# Patient Record
Sex: Male | Born: 1935 | Race: White | Hispanic: No | Marital: Married | Smoking: Former smoker
Health system: Southern US, Community
[De-identification: ages and names within clinical notes are randomized; demographics above are authoritative.]

## PROBLEM LIST (undated history)

## (undated) DIAGNOSIS — M1990 Unspecified osteoarthritis, unspecified site: Secondary | ICD-10-CM

## (undated) DIAGNOSIS — I10 Essential (primary) hypertension: Secondary | ICD-10-CM

## (undated) DIAGNOSIS — I071 Rheumatic tricuspid insufficiency: Secondary | ICD-10-CM

## (undated) DIAGNOSIS — G4733 Obstructive sleep apnea (adult) (pediatric): Secondary | ICD-10-CM

## (undated) DIAGNOSIS — D649 Anemia, unspecified: Secondary | ICD-10-CM

## (undated) DIAGNOSIS — I5022 Chronic systolic (congestive) heart failure: Secondary | ICD-10-CM

## (undated) DIAGNOSIS — I2510 Atherosclerotic heart disease of native coronary artery without angina pectoris: Secondary | ICD-10-CM

## (undated) DIAGNOSIS — S42309A Unspecified fracture of shaft of humerus, unspecified arm, initial encounter for closed fracture: Secondary | ICD-10-CM

## (undated) DIAGNOSIS — N259 Disorder resulting from impaired renal tubular function, unspecified: Secondary | ICD-10-CM

## (undated) DIAGNOSIS — Z95810 Presence of automatic (implantable) cardiac defibrillator: Secondary | ICD-10-CM

## (undated) DIAGNOSIS — E079 Disorder of thyroid, unspecified: Secondary | ICD-10-CM

## (undated) DIAGNOSIS — K219 Gastro-esophageal reflux disease without esophagitis: Secondary | ICD-10-CM

## (undated) DIAGNOSIS — I255 Ischemic cardiomyopathy: Secondary | ICD-10-CM

## (undated) DIAGNOSIS — E785 Hyperlipidemia, unspecified: Secondary | ICD-10-CM

## (undated) HISTORY — DX: Obstructive sleep apnea (adult) (pediatric): G47.33

## (undated) HISTORY — DX: Presence of automatic (implantable) cardiac defibrillator: Z95.810

## (undated) HISTORY — DX: Atherosclerotic heart disease of native coronary artery without angina pectoris: I25.10

## (undated) HISTORY — DX: Essential (primary) hypertension: I10

## (undated) HISTORY — DX: Anemia, unspecified: D64.9

## (undated) HISTORY — DX: Gastro-esophageal reflux disease without esophagitis: K21.9

## (undated) HISTORY — DX: Disorder of thyroid, unspecified: E07.9

## (undated) HISTORY — DX: Rheumatic tricuspid insufficiency: I07.1

## (undated) HISTORY — PX: CORONARY ARTERY BYPASS GRAFT: SHX141

## (undated) HISTORY — DX: Hyperlipidemia, unspecified: E78.5

## (undated) HISTORY — DX: Unspecified osteoarthritis, unspecified site: M19.90

## (undated) HISTORY — DX: Chronic systolic (congestive) heart failure: I50.22

## (undated) HISTORY — PX: TOTAL HIP ARTHROPLASTY: SHX124

## (undated) HISTORY — PX: INSERT / REPLACE / REMOVE PACEMAKER: SUR710

## (undated) HISTORY — DX: Unspecified fracture of shaft of humerus, unspecified arm, initial encounter for closed fracture: S42.309A

## (undated) HISTORY — DX: Ischemic cardiomyopathy: I25.5

## (undated) HISTORY — DX: Disorder resulting from impaired renal tubular function, unspecified: N25.9

---

## 2003-12-05 ENCOUNTER — Ambulatory Visit: Payer: Self-pay | Admitting: Anesthesiology

## 2004-01-16 ENCOUNTER — Ambulatory Visit: Payer: Self-pay | Admitting: Anesthesiology

## 2004-02-08 ENCOUNTER — Ambulatory Visit: Payer: Self-pay | Admitting: Otolaryngology

## 2004-02-12 ENCOUNTER — Ambulatory Visit: Payer: Self-pay | Admitting: Anesthesiology

## 2004-03-10 ENCOUNTER — Ambulatory Visit: Payer: Self-pay | Admitting: Anesthesiology

## 2004-04-08 ENCOUNTER — Ambulatory Visit: Payer: Self-pay | Admitting: Anesthesiology

## 2004-05-13 ENCOUNTER — Ambulatory Visit: Payer: Self-pay | Admitting: Anesthesiology

## 2004-10-10 ENCOUNTER — Ambulatory Visit: Payer: Self-pay | Admitting: Gastroenterology

## 2004-10-15 ENCOUNTER — Ambulatory Visit: Payer: Self-pay | Admitting: Family Medicine

## 2004-11-06 ENCOUNTER — Ambulatory Visit: Payer: Self-pay | Admitting: Gastroenterology

## 2004-12-03 ENCOUNTER — Ambulatory Visit: Payer: Self-pay | Admitting: Physician Assistant

## 2004-12-09 ENCOUNTER — Ambulatory Visit: Payer: Self-pay | Admitting: Gastroenterology

## 2005-03-13 ENCOUNTER — Ambulatory Visit: Payer: Self-pay | Admitting: Gastroenterology

## 2005-04-07 ENCOUNTER — Ambulatory Visit: Payer: Self-pay | Admitting: Gastroenterology

## 2005-07-30 ENCOUNTER — Ambulatory Visit: Payer: Self-pay | Admitting: Anesthesiology

## 2005-07-31 ENCOUNTER — Ambulatory Visit: Payer: Self-pay | Admitting: Anesthesiology

## 2005-07-31 ENCOUNTER — Other Ambulatory Visit: Payer: Self-pay

## 2005-08-17 ENCOUNTER — Ambulatory Visit: Payer: Self-pay | Admitting: Gastroenterology

## 2005-08-26 ENCOUNTER — Ambulatory Visit: Payer: Self-pay | Admitting: Anesthesiology

## 2005-09-24 ENCOUNTER — Ambulatory Visit: Payer: Self-pay | Admitting: Anesthesiology

## 2005-10-08 ENCOUNTER — Ambulatory Visit: Payer: Self-pay | Admitting: Gastroenterology

## 2005-10-19 ENCOUNTER — Ambulatory Visit: Payer: Self-pay | Admitting: Anesthesiology

## 2005-10-28 ENCOUNTER — Ambulatory Visit: Payer: Self-pay | Admitting: Internal Medicine

## 2006-01-07 ENCOUNTER — Ambulatory Visit: Payer: Self-pay | Admitting: Anesthesiology

## 2006-01-13 ENCOUNTER — Encounter: Payer: Self-pay | Admitting: Internal Medicine

## 2006-02-01 ENCOUNTER — Ambulatory Visit: Payer: Self-pay | Admitting: Anesthesiology

## 2006-03-04 ENCOUNTER — Ambulatory Visit: Payer: Self-pay | Admitting: Anesthesiology

## 2006-04-06 ENCOUNTER — Ambulatory Visit: Payer: Self-pay | Admitting: Anesthesiology

## 2006-04-13 ENCOUNTER — Ambulatory Visit: Payer: Self-pay | Admitting: Gastroenterology

## 2006-05-01 ENCOUNTER — Other Ambulatory Visit: Payer: Self-pay

## 2006-05-01 ENCOUNTER — Inpatient Hospital Stay: Payer: Self-pay | Admitting: Internal Medicine

## 2006-05-02 ENCOUNTER — Other Ambulatory Visit: Payer: Self-pay

## 2006-05-03 ENCOUNTER — Other Ambulatory Visit: Payer: Self-pay

## 2006-05-05 ENCOUNTER — Other Ambulatory Visit: Payer: Self-pay

## 2006-05-10 ENCOUNTER — Ambulatory Visit: Payer: Self-pay | Admitting: Anesthesiology

## 2006-05-14 ENCOUNTER — Ambulatory Visit: Payer: Self-pay | Admitting: Vascular Surgery

## 2006-06-02 ENCOUNTER — Ambulatory Visit: Payer: Self-pay | Admitting: Anesthesiology

## 2006-06-23 ENCOUNTER — Ambulatory Visit: Payer: Self-pay | Admitting: Anesthesiology

## 2006-08-02 ENCOUNTER — Ambulatory Visit: Payer: Self-pay | Admitting: Anesthesiology

## 2006-08-23 ENCOUNTER — Ambulatory Visit: Payer: Self-pay | Admitting: Anesthesiology

## 2006-08-28 ENCOUNTER — Ambulatory Visit: Payer: Self-pay | Admitting: Anesthesiology

## 2006-09-01 ENCOUNTER — Ambulatory Visit: Payer: Self-pay | Admitting: Pain Medicine

## 2006-09-14 ENCOUNTER — Ambulatory Visit: Payer: Self-pay | Admitting: Pain Medicine

## 2006-09-20 ENCOUNTER — Other Ambulatory Visit: Payer: Self-pay

## 2006-09-20 ENCOUNTER — Inpatient Hospital Stay: Payer: Self-pay | Admitting: Internal Medicine

## 2006-09-21 ENCOUNTER — Other Ambulatory Visit: Payer: Self-pay

## 2006-09-30 ENCOUNTER — Ambulatory Visit: Payer: Self-pay | Admitting: Pain Medicine

## 2006-10-13 ENCOUNTER — Ambulatory Visit: Payer: Self-pay | Admitting: Physician Assistant

## 2006-11-02 ENCOUNTER — Ambulatory Visit: Payer: Self-pay | Admitting: Physician Assistant

## 2006-12-03 ENCOUNTER — Ambulatory Visit: Payer: Self-pay | Admitting: Physician Assistant

## 2006-12-14 ENCOUNTER — Ambulatory Visit: Payer: Self-pay | Admitting: Gastroenterology

## 2006-12-16 ENCOUNTER — Ambulatory Visit: Payer: Self-pay | Admitting: Pain Medicine

## 2007-01-10 ENCOUNTER — Ambulatory Visit: Payer: Self-pay | Admitting: Physician Assistant

## 2007-01-24 ENCOUNTER — Ambulatory Visit: Payer: Self-pay | Admitting: Pain Medicine

## 2007-01-25 ENCOUNTER — Ambulatory Visit: Payer: Self-pay | Admitting: Pain Medicine

## 2007-03-09 ENCOUNTER — Ambulatory Visit: Payer: Self-pay | Admitting: Pain Medicine

## 2007-04-11 ENCOUNTER — Ambulatory Visit: Payer: Self-pay | Admitting: Pain Medicine

## 2007-05-04 ENCOUNTER — Ambulatory Visit: Payer: Self-pay | Admitting: Pain Medicine

## 2007-07-15 ENCOUNTER — Ambulatory Visit: Payer: Self-pay | Admitting: Family Medicine

## 2007-09-20 ENCOUNTER — Ambulatory Visit: Payer: Self-pay | Admitting: Pain Medicine

## 2007-10-24 ENCOUNTER — Ambulatory Visit: Payer: Self-pay | Admitting: Pain Medicine

## 2008-01-24 ENCOUNTER — Ambulatory Visit: Payer: Self-pay | Admitting: Physician Assistant

## 2008-03-19 ENCOUNTER — Ambulatory Visit: Payer: Self-pay | Admitting: Unknown Physician Specialty

## 2008-04-26 ENCOUNTER — Ambulatory Visit: Payer: Self-pay | Admitting: Physician Assistant

## 2008-04-30 ENCOUNTER — Ambulatory Visit: Payer: Self-pay | Admitting: Internal Medicine

## 2008-05-09 ENCOUNTER — Ambulatory Visit: Payer: Self-pay | Admitting: Internal Medicine

## 2008-05-09 LAB — CONVERTED CEMR LAB
BUN: 29 mg/dL — ABNORMAL HIGH (ref 6–23)
Calcium: 9.5 mg/dL (ref 8.4–10.5)
Creatinine, Ser: 1.7 mg/dL — ABNORMAL HIGH (ref 0.40–1.50)
MCV: 98.5 fL (ref 78.0–100.0)
Platelets: 238 10*3/uL (ref 150–400)
Potassium: 4.3 meq/L (ref 3.5–5.3)
Prothrombin Time: 14 s (ref 11.6–15.2)
RDW: 13.6 % (ref 11.5–15.5)
WBC: 5.7 10*3/uL (ref 4.0–10.5)

## 2008-05-11 ENCOUNTER — Inpatient Hospital Stay (HOSPITAL_COMMUNITY): Admission: RE | Admit: 2008-05-11 | Discharge: 2008-05-14 | Payer: Self-pay | Admitting: Internal Medicine

## 2008-05-11 ENCOUNTER — Ambulatory Visit: Payer: Self-pay | Admitting: Internal Medicine

## 2008-05-15 ENCOUNTER — Encounter: Payer: Self-pay | Admitting: Internal Medicine

## 2008-05-17 ENCOUNTER — Ambulatory Visit: Payer: Self-pay | Admitting: Internal Medicine

## 2008-05-21 ENCOUNTER — Encounter: Payer: Self-pay | Admitting: Cardiovascular Disease

## 2008-05-21 ENCOUNTER — Ambulatory Visit: Payer: Self-pay | Admitting: Internal Medicine

## 2008-05-21 LAB — CONVERTED CEMR LAB
ALT: 12 units/L (ref 0–53)
AST: 27 units/L (ref 0–37)
Albumin: 3.8 g/dL (ref 3.5–5.2)
Alkaline Phosphatase: 36 units/L — ABNORMAL LOW (ref 39–117)
Glucose, Bld: 99 mg/dL (ref 70–99)
Potassium: 4.9 meq/L (ref 3.5–5.3)
Sodium: 140 meq/L (ref 135–145)
Total Protein: 6.3 g/dL (ref 6.0–8.3)

## 2008-06-11 ENCOUNTER — Ambulatory Visit: Payer: Self-pay | Admitting: Internal Medicine

## 2008-06-11 ENCOUNTER — Encounter: Payer: Self-pay | Admitting: Cardiovascular Disease

## 2008-06-11 LAB — CONVERTED CEMR LAB
BUN: 34 mg/dL — ABNORMAL HIGH (ref 6–23)
Calcium: 9 mg/dL (ref 8.4–10.5)
Creatinine, Ser: 1.98 mg/dL — ABNORMAL HIGH (ref 0.40–1.50)
Glucose, Bld: 99 mg/dL (ref 70–99)

## 2008-06-14 ENCOUNTER — Ambulatory Visit: Payer: Self-pay | Admitting: Unknown Physician Specialty

## 2008-07-19 ENCOUNTER — Ambulatory Visit: Payer: Self-pay | Admitting: Physician Assistant

## 2008-08-06 ENCOUNTER — Inpatient Hospital Stay: Payer: Self-pay | Admitting: Internal Medicine

## 2008-08-08 DIAGNOSIS — G4733 Obstructive sleep apnea (adult) (pediatric): Secondary | ICD-10-CM

## 2008-08-08 DIAGNOSIS — I10 Essential (primary) hypertension: Secondary | ICD-10-CM | POA: Insufficient documentation

## 2008-08-08 DIAGNOSIS — E785 Hyperlipidemia, unspecified: Secondary | ICD-10-CM

## 2008-08-08 DIAGNOSIS — M1990 Unspecified osteoarthritis, unspecified site: Secondary | ICD-10-CM | POA: Insufficient documentation

## 2008-08-08 DIAGNOSIS — N259 Disorder resulting from impaired renal tubular function, unspecified: Secondary | ICD-10-CM | POA: Insufficient documentation

## 2008-08-08 DIAGNOSIS — K219 Gastro-esophageal reflux disease without esophagitis: Secondary | ICD-10-CM

## 2008-08-08 DIAGNOSIS — I498 Other specified cardiac arrhythmias: Secondary | ICD-10-CM

## 2008-08-30 ENCOUNTER — Emergency Department: Payer: Self-pay | Admitting: Emergency Medicine

## 2008-09-04 ENCOUNTER — Encounter: Payer: Self-pay | Admitting: Internal Medicine

## 2008-09-05 ENCOUNTER — Encounter: Payer: Self-pay | Admitting: Internal Medicine

## 2008-09-10 ENCOUNTER — Encounter: Payer: Self-pay | Admitting: Internal Medicine

## 2008-09-10 ENCOUNTER — Ambulatory Visit: Payer: Self-pay | Admitting: Internal Medicine

## 2008-10-18 ENCOUNTER — Ambulatory Visit: Payer: Self-pay | Admitting: Physician Assistant

## 2008-12-10 ENCOUNTER — Ambulatory Visit: Payer: Self-pay | Admitting: Internal Medicine

## 2008-12-10 DIAGNOSIS — R42 Dizziness and giddiness: Secondary | ICD-10-CM | POA: Insufficient documentation

## 2008-12-12 ENCOUNTER — Encounter: Payer: Self-pay | Admitting: Internal Medicine

## 2009-01-23 ENCOUNTER — Ambulatory Visit: Payer: Self-pay | Admitting: Physician Assistant

## 2009-04-01 ENCOUNTER — Ambulatory Visit: Payer: Self-pay | Admitting: Internal Medicine

## 2009-04-22 ENCOUNTER — Ambulatory Visit: Payer: Self-pay | Admitting: Pain Medicine

## 2009-05-09 ENCOUNTER — Ambulatory Visit: Payer: Self-pay | Admitting: Pain Medicine

## 2009-05-20 ENCOUNTER — Ambulatory Visit: Payer: Self-pay

## 2009-05-20 ENCOUNTER — Ambulatory Visit: Payer: Self-pay | Admitting: Internal Medicine

## 2009-05-20 ENCOUNTER — Ambulatory Visit (HOSPITAL_COMMUNITY): Admission: RE | Admit: 2009-05-20 | Discharge: 2009-05-20 | Payer: Self-pay | Admitting: Internal Medicine

## 2009-05-20 ENCOUNTER — Encounter: Payer: Self-pay | Admitting: Internal Medicine

## 2009-06-17 ENCOUNTER — Ambulatory Visit: Payer: Self-pay | Admitting: Pain Medicine

## 2009-09-06 ENCOUNTER — Ambulatory Visit: Payer: Self-pay | Admitting: Internal Medicine

## 2009-09-06 DIAGNOSIS — I5022 Chronic systolic (congestive) heart failure: Secondary | ICD-10-CM

## 2009-09-11 ENCOUNTER — Encounter: Payer: Self-pay | Admitting: Internal Medicine

## 2010-01-03 ENCOUNTER — Encounter: Payer: Self-pay | Admitting: Internal Medicine

## 2010-01-03 ENCOUNTER — Ambulatory Visit: Payer: Self-pay | Admitting: Internal Medicine

## 2010-03-29 ENCOUNTER — Encounter: Payer: Self-pay | Admitting: Internal Medicine

## 2010-04-01 NOTE — Cardiovascular Report (Signed)
Summary: Office Visit   Office Visit   Imported By: Roderic Ovens 06/04/2009 15:48:30  _____________________________________________________________________  External Attachment:    Type:   Image     Comment:   External Document

## 2010-04-01 NOTE — Assessment & Plan Note (Signed)
Summary: PACER/AMD   Visit Type:  Follow-up Referring Provider:  Gwen Pounds Primary Provider:  Jerl Mina, MD  CC:  no complaint / very upset.  History of Present Illness:  Randall David is seen in followup for atrial arrhythmias ischemic cardiomyopathy  congestive heart failure and a previously implanted CRT-D;  His amiodarone was stopped previously because of neuro toxicities; he is much improved in terms of dizziness and clarity of thought He continues to complain of shortness of breath. There has been no significant change in this over the last 4 months (see below).  His major concern however his frustration at the poor communication between me and Dr. Gwen Pounds. I had told her last visit that he had atrial tachycardia and is concerned about left atrial appendage emptying velocities and I would communicate this with Dr. Gwen Pounds. I don't recall any of the details of that , but that communication didn't happen.  Current Problems (verified): 1)  Orthostatic Dizziness  (ICD-780.4) 2)  Cardiomyopathy, Ischemic S/p Cabg  (ICD-414.8) 3)  Implantable Defibrillator Crt Mdt  (ICD-V45.02) 4)  Systolic Heart Failure, Acute On Chronic  (ICD-428.23) 5)  Atrial Tachycardia  (ICD-427.89) 6)  Hyperlipidemia-mixed  (ICD-272.4) 7)  Hypertension, Unspecified  (ICD-401.9) 8)  Renal Insufficiency  (ICD-588.9) 9)  Degenerative Joint Disease  (ICD-715.90) 10)  Gerd  (ICD-530.81) 11)  Sleep Apnea, Obstructive  (ICD-327.23)  Allergies (verified): No Known Drug Allergies  Past History:  Past Medical History: Last updated: 08/08/2008 SYSTOLIC HEART FAILURE, ACUTE ON CHRONIC (ICD-428.23) TACHYCARDIA (ICD-785) ICD - IN SITU (ICD-V45.02) HYPERLIPIDEMIA-MIXED (ICD-272.4) HYPERTENSION, UNSPECIFIED (ICD-401.9) RENAL INSUFFICIENCY (ICD-588.9) DEGENERATIVE JOINT DISEASE (ICD-715.90) GERD (ICD-530.81) SLEEP APNEA, OBSTRUCTIVE (ICD-327.23)    Past Surgical History: Last updated: 08/08/2008 CABG Hip  Arthroplasty-Total  Family History: Last updated: 08/08/2008 Family History of Cancer:  renal failure  Social History: Last updated: 08/08/2008 Retired  Married  Tobacco Use - Former.  Alcohol Use - yes Regular Exercise - no Drug Use - no  Risk Factors: Exercise: no (08/08/2008)  Risk Factors: Smoking Status: quit (08/08/2008)  Vital Signs:  Patient profile:   77 year old male Height:      69 inches Weight:      177.25 pounds BMI:     26.27 Pulse rate:   76 / minute Pulse rhythm:   regular BP sitting:   112 / 60  (left arm) Cuff size:   regular  Vitals Entered By: Charlena Cross, RN, BSN (April 01, 2009 10:52 AM)  Physical Exam  General:  Well developed, well nourished, in no acute distress. Head:  HEENT Neck:  Neck supple, JVP was 10-11 cm. No masses, thyromegaly or abnormal cervical nodes. Lungs:  Clear bilaterally to auscultation and percussion. Heart:  regular rate and rhythm with a displaced and dyskinetic PMI A 2 to 3/6 systolic murmur heard over the apex Abdomen:  soft nontender with active bowel Msk:  Back normal, normal gait. Muscle strength and tone normal. Pulses:  pulses normal in all 4 extremities Neurologic:  grossly normal,  a x ox3 Skin:  warm and dry Psych:  depressed affect.     EKG  Procedure date:  04/01/2009  Findings:      AV paced   ICD Specifications Following MD:  Sherryl Manges, MD     ICD Vendor:  [employer]     ICD Model Number:  [id number]     ICD Serial Number:  [id number] ICD DOI:  05/11/2008     ICD Implanting MD:  Sherryl Manges, MD  Lead  1:    Location: RA     DOI: 05/11/2008     Model #: 1610     Serial #: [id number]     Status: active Lead 2:    Location: RV     DOI: 05/11/2008     Model #: 1914     Serial #: [id number]     Status: active Lead 3:    Location: LV     DOI: 05/11/2008     Model #: 0865     Serial #: [id number]     Status: active  Indications::  ICM   ICD Follow Up ICD Dependent:  No       Episodes Coumadin:  No  Brady Parameters Mode DDDR     Lower Rate Limit:  70     Upper Rate Limit 110 PAV 130     Sensed AV Delay:  100  Tachy Zones VF:  207     VT:  171     Impression & Recommendations:  Problem # 1:  SYSTOLIC HEART FAILURE, ACUTE ON CHRONIC (ICD-428.23) Randall David heart failure remains stable and chronic. I have suggested that we undertake an AV optimization echo to see if there is any program he we can do to improve his status His updated medication list for this problem includes:    Isosorbide Mononitrate Cr 30 Mg Xr24h-tab (Isosorbide mononitrate) .Marland Kitchen... Take one tablet by mouth daily    Metoprolol Tartrate 50 Mg Tabs (Metoprolol tartrate) .Marland Kitchen... Take 1 in the am and 1/2 at bedtime    Nitroglycerin 0.4 Mg Subl (Nitroglycerin) ..... One tablet under tongue every 5 minutes as needed for chest pain---may repeat times three    Furosemide 40 Mg Tabs (Furosemide) .Marland Kitchen... Take 1 by mouth two times a day    Aspirin 81 Mg Tbec (Aspirin) .Marland Kitchen... Take one tablet by mouth daily  Problem # 2:  CARDIOMYOPATHY, ISCHEMIC S/P CABG (ICD-414.8) no current problems with chest pain His updated medication list for this problem includes:    Isosorbide Mononitrate Cr 30 Mg Xr24h-tab (Isosorbide mononitrate) .Marland Kitchen... Take one tablet by mouth daily    Metoprolol Tartrate 50 Mg Tabs (Metoprolol tartrate) .Marland Kitchen... Take 1 in the am and 1/2 at bedtime    Nitroglycerin 0.4 Mg Subl (Nitroglycerin) ..... One tablet under tongue every 5 minutes as needed for chest pain---may repeat times three    Furosemide 40 Mg Tabs (Furosemide) .Marland Kitchen... Take 1 by mouth two times a day    Aspirin 81 Mg Tbec (Aspirin) .Marland Kitchen... Take one tablet by mouth daily  Problem # 3:  ATRIAL TACHYCARDIA (ICD-427.89) interrogation of his device demonstrated the atrial tachycardia resolved spontaneously about mid-November. Unfortunately there has been no improvement in his functional capacity since then. Given the relatively slow rates of  his atrial tachycardia I don't think there is indication yet clearly for Coumadin His updated medication list for this problem includes:    Isosorbide Mononitrate Cr 30 Mg Xr24h-tab (Isosorbide mononitrate) .Marland Kitchen... Take one tablet by mouth daily    Metoprolol Tartrate 50 Mg Tabs (Metoprolol tartrate) .Marland Kitchen... Take 1 in the am and 1/2 at bedtime    Nitroglycerin 0.4 Mg Subl (Nitroglycerin) ..... One tablet under tongue every 5 minutes as needed for chest pain---may repeat times three    Aspirin 81 Mg Tbec (Aspirin) .Marland Kitchen... Take one tablet by mouth daily  Problem # 4:  IMPLANTABLE DEFIBRILLATOR CRT MDT (ICD-V45.02) Device parameters and data were reviewed and no changes were made  Appended Document:  [facility]     Referring Provider:  Gwen Pounds Primary Provider:  Jerl Mina, MD   History of Present Illness: Randall David is also clearly quite depressed. This has been a long-standing history of this and he is taking antidepressants for her. There is some improvement over time with various medical regimes. I have suggested that he follow up with Dr. Burnett Sheng about this to see if there can be any further medication adjustments.  Allergies: No Known Drug Allergies    ICD Specifications Following MD:  Sherryl Manges, MD     ICD Vendor:  [employer]     ICD Model Number:  [phone]     ICD Serial Number:  [id number] ICD DOI:  05/11/2008     ICD Implanting MD:  Sherryl Manges, MD  Lead 1:    Location: RA     DOI: 05/11/2008     Model #: 1478     Serial #: [id number]     Status: active Lead 2:    Location: RV     DOI: 05/11/2008     Model #: 5784     Serial #: [id number]     Status: active Lead 3:    Location: LV     DOI: 05/11/2008     Model #: 1324     Serial #: [id number]     Status: active  Indications::  ICM   ICD Follow Up ICD Dependent:  No      Episodes Coumadin:  No  Brady Parameters Mode DDDR     Lower Rate Limit:  70     Upper Rate Limit 110 PAV 130     Sensed AV Delay:   100  Tachy Zones VF:  207     VT:  171

## 2010-04-01 NOTE — Cardiovascular Report (Signed)
Summary: Office Visit   Office Visit   Imported By: Roderic Ovens 01/09/2010 14:28:39  _____________________________________________________________________  External Attachment:    Type:   Image     Comment:   External Document

## 2010-04-01 NOTE — Procedures (Signed)
Summary: icd check/medtronic   Current Medications (verified): 1)  Citalopram Hydrobromide 40 Mg Tabs (Citalopram Hydrobromide) .Marland Kitchen.. 1 By Mouth Once Daily 2)  Isosorbide Mononitrate Cr 30 Mg Xr24h-Tab (Isosorbide Mononitrate) .... Take One Tablet By Mouth Daily 3)  Fenofibrate Micronized 200 Mg Caps (Fenofibrate Micronized) .Marland Kitchen.. 1 By Mouth At Bedtime 4)  Metoprolol Tartrate 50 Mg Tabs (Metoprolol Tartrate) .... Take 1 in The Am and 1/2 At Bedtime 5)  Nitroglycerin 0.4 Mg Subl (Nitroglycerin) .... One Tablet Under Tongue Every 5 Minutes As Needed For Chest Pain---May Repeat Times Three 6)  Simvastatin 40 Mg Tabs (Simvastatin) .... Take One Tablet By Mouth Daily At Bedtime 7)  Oxycodone-Acetaminophen 10-325 Mg Tabs (Oxycodone-Acetaminophen) .... As Needed 8)  Zolpidem Tartrate 10 Mg Tabs (Zolpidem Tartrate) .Marland Kitchen.. 1 By Mouth At Bedtime 9)  Furosemide 40 Mg Tabs (Furosemide) .... Take 1 By Mouth Two Times A Day 10)  Omeprazole 20 Mg Cpdr (Omeprazole) .Marland Kitchen.. 1 By Mouth Bid 11)  Senokot S 8.6-50 Mg Tabs (Sennosides-Docusate Sodium) .... 2 By Mouth At Bedtime 12)  Vitamin D 1000 Unit Tabs (Cholecalciferol) .Marland Kitchen.. 1 By Mouth Once Daily 13)  Fish Oil 1200 Mg Caps (Omega-3 Fatty Acids) .Marland Kitchen.. 1 By Mouth Two Times A Day 14)  Centrum Silver  Tabs (Multiple Vitamins-Minerals) .Marland Kitchen.. 1 By Mouth Once Daily 15)  Glucosamine-Chondroitin  Caps (Glucosamine-Chondroit-Vit C-Mn) .Marland Kitchen.. 1 By Mouth Once Daily 16)  Coenzyme Q10 10 Mg Caps (Coenzyme Q10) .Marland Kitchen.. 1 By Mouth Once Daily 17)  Aspirin 81 Mg Tbec (Aspirin) .... Take One Tablet By Mouth Daily 18)  Tums 500 Mg Chew (Calcium Carbonate Antacid) .Marland Kitchen.. 1 By Mouth Once Daily As Needed 19)  Lorazepam 1 Mg Tabs (Lorazepam) .... As Needed 20)  Gabapentin 300 Mg Caps (Gabapentin) .... Up To 3 Tabs 3-4 Times Daily 21)  Oxycontin 30 Mg Xr12h-Tab (Oxycodone Hcl) .... Two Times A Day  Allergies (verified): No Known Drug Allergies    ICD Specifications Following MD:  Sherryl Manges, MD     Referring MD:  [facility] ICD Vendor:  [employer]     ICD Model Number:  [id number]     ICD Serial Number:  [id number] ICD DOI:  05/11/2008     ICD Implanting MD:  Sherryl Manges, MD  Lead 1:    Location: RA     DOI: 05/11/2008     Model #: 1478     Serial #: [id number]     Status: active Lead 2:    Location: RV     DOI: 05/11/2008     Model #: 5784     Serial #: [id number]     Status: active Lead 3:    Location: LV     DOI: 05/11/2008     Model #: 1324     Serial #: [id number]     Status: active  Indications::  ICM   ICD Follow Up Remote Check?  No Battery Voltage:  3.07 V     Charge Time:  9.3 seconds     Underlying rhythm:  Huston Foley ICD Dependent:  No       ICD Device Measurements Atrium:  Amplitude: 0.4 mV, Impedance: 494 ohms, Threshold: 0.75 V at 0.4 msec Right Ventricle:  Amplitude: 2.5 mV, Impedance: 437 ohms, Threshold: 1.5 V at 0.4 msec Left Ventricle:  Impedance: 323 ohms, Threshold: 0.75 V at 0.4 msec Configuration: LV RING TO RV COIL Shock Impedance: 40/51 ohms   Episodes MS Episodes:  3     Percent Mode  Switch:  <0.1%     Coumadin:  No Shock:  0     ATP:  0     Nonsustained:  1     Atrial Pacing:  99.5%     Ventricular Pacing:  97.1%  Brady Parameters Mode DDDR     Lower Rate Limit:  70     Upper Rate Limit 110 PAV 250     Sensed AV Delay:  220  Tachy Zones VF:  207     VT:  171     Next Cardiology Appt Due:  04/02/2010 Tech Comments:  3 mode switch episodes probable atrial tachy rate 150bpm.  No parameter changes.  Device function normal. Optivol and thoracic impedance normal.   No [facility] @ this time.  ROV 3 months [place] [facility]. Altha Harm, LPN  January 03, 2010 4:33 PM

## 2010-04-01 NOTE — Procedures (Signed)
Summary: Cardiology Device Clinic   Allergies: No Known Drug Allergies   ICD Specifications Following MD:  Sherryl Manges, MD     Referring MD:  [facility] ICD Vendor:  [employer]     ICD Model Number:  [id number]     ICD Serial Number:  [id number] ICD DOI:  05/11/2008     ICD Implanting MD:  Sherryl Manges, MD  Lead 1:    Location: RA     DOI: 05/11/2008     Model #: 4010     Serial #: [id number]     Status: active Lead 2:    Location: RV     DOI: 05/11/2008     Model #: 3474     Serial #: [id number]     Status: active Lead 3:    Location: LV     DOI: 05/11/2008     Model #: 6433     Serial #: [id number]     Status: active  Indications::  ICM   ICD Follow Up Remote Check?  No Battery Voltage:  3.11 V     Charge Time:  8.9 seconds     Underlying rhythm:  SB 30'S ICD Dependent:  No       ICD Device Measurements Atrium:  Amplitude: 0.6 mV, Impedance: 475 ohms, Threshold: 0.5 V at 0.4 msec Right Ventricle:  Amplitude: 2.8 mV, Impedance: 342 ohms, Threshold: 1.0 V at 0.4 msec Left Ventricle:  Impedance: 285 ohms, Threshold: 0.75 V at 0.4 msec Configuration: LV RING TO RV COIL Shock Impedance: 37/45 ohms   Episodes MS Episodes:  4     Percent Mode Switch:  0.1%     Coumadin:  No Shock:  0     ATP:  0     Nonsustained:  2     Atrial Pacing:  99.6%     Ventricular Pacing:  99.9%  Brady Parameters Mode DDDR     Lower Rate Limit:  70     Upper Rate Limit 110 PAV 250     Sensed AV Delay:  220  Tachy Zones VF:  207     VT:  171     Next Cardiology Appt Due:  07/31/2009 Tech Comments:  Pt seen today for AV opt.  AS/VS interval , AP/VS interval .  LV-RV programmed simultaneous.  PAV changed to 250 from 130 today per Dr Graciela Husbands.  1 VT episode that started slowly and ramped up into VT zone over about 17 seconds.  No changes in detection or therapy programming per Dr Graciela Husbands today. Gypsy Balsam RN BSN  May 21, 2009 10:36 AM

## 2010-04-01 NOTE — Procedures (Signed)
Summary: PACER/AMD   Current Medications (verified): 1)  Citalopram Hydrobromide 20 Mg Tabs (Citalopram Hydrobromide) .Marland Kitchen.. 1 By Mouth Once Daily 2)  Isosorbide Mononitrate Cr 30 Mg Xr24h-Tab (Isosorbide Mononitrate) .... Take One Tablet By Mouth Daily 3)  Fenofibrate Micronized 200 Mg Caps (Fenofibrate Micronized) .Marland Kitchen.. 1 By Mouth At Bedtime 4)  Metoprolol Tartrate 50 Mg Tabs (Metoprolol Tartrate) .... Take 1 in The Am and 1/2 At Bedtime 5)  Nitroglycerin 0.4 Mg Subl (Nitroglycerin) .... One Tablet Under Tongue Every 5 Minutes As Needed For Chest Pain---May Repeat Times Three 6)  Simvastatin 80 Mg Tabs (Simvastatin) .... Take One Tablet By Mouth Daily At Bedtime 7)  Oxycodone-Acetaminophen 10-325 Mg Tabs (Oxycodone-Acetaminophen) .... As Needed 8)  Zolpidem Tartrate 10 Mg Tabs (Zolpidem Tartrate) .Marland Kitchen.. 1 By Mouth At Bedtime 9)  Furosemide 40 Mg Tabs (Furosemide) .... Take 1 By Mouth Two Times A Day 10)  Omeprazole 20 Mg Cpdr (Omeprazole) .Marland Kitchen.. 1 By Mouth Bid 11)  Senokot S 8.6-50 Mg Tabs (Sennosides-Docusate Sodium) .... 2 By Mouth At Bedtime 12)  Vitamin D 1000 Unit Tabs (Cholecalciferol) .Marland Kitchen.. 1 By Mouth Once Daily 13)  Fish Oil 1200 Mg Caps (Omega-3 Fatty Acids) .Marland Kitchen.. 1 By Mouth Two Times A Day 14)  Centrum Silver  Tabs (Multiple Vitamins-Minerals) .Marland Kitchen.. 1 By Mouth Once Daily 15)  Glucosamine-Chondroitin  Caps (Glucosamine-Chondroit-Vit C-Mn) .Marland Kitchen.. 1 By Mouth Once Daily 16)  Coenzyme Q10 10 Mg Caps (Coenzyme Q10) .Marland Kitchen.. 1 By Mouth Once Daily 17)  Aspirin 81 Mg Tbec (Aspirin) .... Take One Tablet By Mouth Daily 18)  Tums 500 Mg Chew (Calcium Carbonate Antacid) .Marland Kitchen.. 1 By Mouth Once Daily As Needed 19)  Lorazepam 1 Mg Tabs (Lorazepam) .... As Needed  Allergies (verified): No Known Drug Allergies    ICD Specifications Following MD:  Sherryl Manges, MD     ICD Vendor:  [employer]     ICD Model Number:  [phone]     ICD Serial Number:  [id number] ICD DOI:  05/11/2008     ICD Implanting MD:  Sherryl Manges, MD  Lead 1:    Location: RA     DOI: 05/11/2008     Model #: 1478     Serial #: [id number]     Status: active Lead 2:    Location: RV     DOI: 05/11/2008     Model #: 5784     Serial #: [id number]     Status: active Lead 3:    Location: LV     DOI: 05/11/2008     Model #: 1324     Serial #: [id number]     Status: active  Indications::  ICM   ICD Follow Up Remote Check?  No Battery Voltage:  3.13 V     Charge Time:  8.6 seconds     Underlying rhythm:  Huston Foley ICD Dependent:  No       ICD Device Measurements Atrium:  Amplitude: 0.8 mV, Impedance: 475 ohms, Threshold: 0.75 V at 0.4 msec Right Ventricle:  Amplitude: 3.3 mV, Impedance: 437 ohms, Threshold: 1.0 V at 0.4 msec Left Ventricle:  Impedance: 342 ohms, Threshold: 0.75 V at 0.4 msec  Episodes MS Episodes:  636     Percent Mode Switch:  22.9$     Coumadin:  No Shock:  0     ATP:  0     Nonsustained:  0     Atrial Pacing:  79.1%     Ventricular Pacing:  99.8%  Brady Parameters Mode DDDR     Lower Rate Limit:  70     Upper Rate Limit 110 PAV 130     Sensed AV Delay:  100  Tachy Zones VF:  207     VT:  171     Next Cardiology Appt Due:  05/31/2009 Tech Comments:  RA reprogrammed 2.0@0 .4.  A-fib 22.9%, he is not on coumadin.  He is a patient of Dr. Philemon Kingdom.  Optivol abnormal 11/28-12/9. Altha Harm, LPN  April 01, 2009 9:59 AM

## 2010-04-01 NOTE — Assessment & Plan Note (Signed)
Summary: ROV/AMD   Visit Type:  Follow-up Referring Provider:  Gwen Pounds Primary Provider:  Jerl Mina, MD  CC:  shortness of breath.  History of Present Illness:     Mr. Baughman is seen in followup for atrial arrhythmias ischemic cardiomyopathy  congestive heart failure and a previously implanted CRT-D;    The shortness of breath is stable he has no peripheral edema and no significant chest pains.  His biggest complaint is fatigue. He has some sleep disordered breathing according to his wife as well as daytime somnolence. I should note also that he takes narcotics for chronic back pain  . He recently underwent repeat echo which demonstrated.     - Left ventricle: LVEF is reduced at approximately 20 to 25% with       diffuse hypokinesis, posterior akinesis.     - Aortic valve: AV is thickened, calcified with minimally restricted       motion. Trivial regurgitation.     - Mitral valve: Mild regurgitation.     - Left atrium: The atrium was moderately dilated.     - Right ventricle: Systolic function was moderately reduced.     - Right atrium: The atrium was moderately dilated.     - Tricuspid valve: Moderate regurgitation.  he is to work with Dr. Burnett Sheng on depression  Current Medications (verified): 1)  Citalopram Hydrobromide 20 Mg Tabs (Citalopram Hydrobromide) .Marland Kitchen.. 1 By Mouth Once Daily 2)  Isosorbide Mononitrate Cr 30 Mg Xr24h-Tab (Isosorbide Mononitrate) .... Take One Tablet By Mouth Daily 3)  Fenofibrate Micronized 200 Mg Caps (Fenofibrate Micronized) .Marland Kitchen.. 1 By Mouth At Bedtime 4)  Metoprolol Tartrate 50 Mg Tabs (Metoprolol Tartrate) .... Take 1 in The Am and 1/2 At Bedtime 5)  Nitroglycerin 0.4 Mg Subl (Nitroglycerin) .... One Tablet Under Tongue Every 5 Minutes As Needed For Chest Pain---May Repeat Times Three 6)  Simvastatin 80 Mg Tabs (Simvastatin) .... Take One Tablet By Mouth Daily At Bedtime 7)  Oxycodone-Acetaminophen 10-325 Mg Tabs (Oxycodone-Acetaminophen) ....  As Needed 8)  Zolpidem Tartrate 10 Mg Tabs (Zolpidem Tartrate) .Marland Kitchen.. 1 By Mouth At Bedtime 9)  Furosemide 40 Mg Tabs (Furosemide) .... Take 1 By Mouth Two Times A Day 10)  Omeprazole 20 Mg Cpdr (Omeprazole) .Marland Kitchen.. 1 By Mouth Bid 11)  Senokot S 8.6-50 Mg Tabs (Sennosides-Docusate Sodium) .... 2 By Mouth At Bedtime 12)  Vitamin D 1000 Unit Tabs (Cholecalciferol) .Marland Kitchen.. 1 By Mouth Once Daily 13)  Fish Oil 1200 Mg Caps (Omega-3 Fatty Acids) .Marland Kitchen.. 1 By Mouth Two Times A Day 14)  Centrum Silver  Tabs (Multiple Vitamins-Minerals) .Marland Kitchen.. 1 By Mouth Once Daily 15)  Glucosamine-Chondroitin  Caps (Glucosamine-Chondroit-Vit C-Mn) .Marland Kitchen.. 1 By Mouth Once Daily 16)  Coenzyme Q10 10 Mg Caps (Coenzyme Q10) .Marland Kitchen.. 1 By Mouth Once Daily 17)  Aspirin 81 Mg Tbec (Aspirin) .... Take One Tablet By Mouth Daily 18)  Tums 500 Mg Chew (Calcium Carbonate Antacid) .Marland Kitchen.. 1 By Mouth Once Daily As Needed 19)  Lorazepam 1 Mg Tabs (Lorazepam) .... As Needed 20)  Gabapentin 300 Mg Caps (Gabapentin) .... Up To 3 Tabs 3-4 Times Daily 21)  Oxycontin 30 Mg Xr12h-Tab (Oxycodone Hcl) .... Two Times A Day  Allergies (verified): No Known Drug Allergies  Past History:  Past Medical History: Last updated: 08/08/2008 SYSTOLIC HEART FAILURE, ACUTE ON CHRONIC (ICD-428.23) TACHYCARDIA (ICD-785) ICD - IN SITU (ICD-V45.02) HYPERLIPIDEMIA-MIXED (ICD-272.4) HYPERTENSION, UNSPECIFIED (ICD-401.9) RENAL INSUFFICIENCY (ICD-588.9) DEGENERATIVE JOINT DISEASE (ICD-715.90) GERD (ICD-530.81) SLEEP APNEA, OBSTRUCTIVE (ICD-327.23)  Past Surgical History: Last updated: 08/08/2008 CABG Hip Arthroplasty-Total  Vital Signs:  Patient profile:   77 year old male Height:      69 inches Weight:      175 pounds Pulse rate:   90 / minute BP sitting:   120 / 65  (left arm) Cuff size:   regular  Vitals Entered By: Hardin Negus, RMA (September 06, 2009 3:30 PM)  Physical Exam  General:  The patient was alert and oriented in no acute distress. HEENT  Normal.  Neck veins were flat, carotids were brisk.  Lungs were clear.  Heart sounds were regular with 2/6 systolic murmur Abdomen was soft with active bowel sounds. There is no clubbing cyanosis; trace edema on the left Skin Warm and dry     ICD Specifications Following MD:  Sherryl Manges, MD     Referring MD:  [facility] ICD Vendor:  Medtronic     ICD Model Number:  [id number]     ICD Serial Number:  [id number] ICD DOI:  05/11/2008     ICD Implanting MD:  Sherryl Manges, MD  Lead 1:    Location: RA     DOI: 05/11/2008     Model #: 3557     Serial #: [id number]     Status: active Lead 2:    Location: RV     DOI: 05/11/2008     Model #: [id number]     Serial #: [id number]     Status: active Lead 3:    Location: LV     DOI: 05/11/2008     Model #: [id number]     Serial #: [id number]     Status: active  Indications::  ICM   ICD Follow Up Remote Check?  No Battery Voltage:  3.10 V     Charge Time:  8.9 seconds     Underlying rhythm:  Huston Foley ICD Dependent:  No       ICD Device Measurements Atrium:  Amplitude: 0.5 mV, Impedance: 475 ohms, Threshold: 1.0 V at 0.4 msec Right Ventricle:  Amplitude: 2.5 mV, Impedance: 380 ohms, Threshold: 1.0 V at 0.4 msec Left Ventricle:  Impedance: 323 ohms, Threshold: 1.0 V at 0.4 msec Configuration: LV RING TO RV COIL Shock Impedance: 39/49 ohms   Episodes MS Episodes:  23     Percent Mode Switch:  0.5%     Coumadin:  No Shock:  0     ATP:  0     Nonsustained:  0     Atrial Pacing:  99.5%     Ventricular Pacing:  98.2%  Brady Parameters Mode DDDR     Lower Rate Limit:  70     Upper Rate Limit 110 PAV 250     Sensed AV Delay:  220  Tachy Zones VF:  207     VT:  171     Next Cardiology Appt Due:  11/30/2009 Tech Comments:  No parameter changes..  Some FFRW noted but because of his P & R waves being chronically small it's difficult to program around this.  Optivol and thoracic impedance abnormal 62-610.  Rate response blunted but adequate for the patient's level of  activity given that he had back issues and is limited in the amount of walking he can do.  ROV 3 months with [facility]. Altha Harm, LPN  September 06, 8544 3:55 PM   Impression & Recommendations:  Problem # 1:  ORTHOSTATIC DIZZINESS (ICD-780.4) improved  Problem # 2:  SLEEP APNEA (ICD-780.57) it sounds like he has sleep apnea clinically. We'll undertake a 2 night Night Watch study as a home sleep study.  Problem # 3:  IMPLANTABLE DEFIBRILLATOR CRT MDT (ICD-V45.02) Device parameters and data were reviewed and no changes were made  Problem # 4:  SYSTOLIC HEART FAILURE, CHRONIC (ICD-428.22) stable will continue current meds His updated medication list for this problem includes:    Isosorbide Mononitrate Cr 30 Mg Xr24h-tab (Isosorbide mononitrate) .Marland Kitchen... Take one tablet by mouth daily    Metoprolol Tartrate 50 Mg Tabs (Metoprolol tartrate) .Marland Kitchen... Take 1 in the am and 1/2 at bedtime    Nitroglycerin 0.4 Mg Subl (Nitroglycerin) ..... One tablet under tongue every 5 minutes as needed for chest pain---may repeat times three    Furosemide 40 Mg Tabs (Furosemide) .Marland Kitchen... Take 1 by mouth two times a day    Aspirin 81 Mg Tbec (Aspirin) .Marland Kitchen... Take one tablet by mouth daily

## 2010-04-03 NOTE — Miscellaneous (Signed)
Summary: CORRECTED DEVICE INFORMATION  Clinical Lists Changes  Observations: Added new observation of ICDLEADSER3: [id number] (03/29/2010 11:23)       ICD Specifications Following MD:  Sherryl Manges, MD     Referring MD:  [facility] ICD Vendor:  Medtronic     ICD Model Number:  [id number]     ICD Serial Number:  [id number] ICD DOI:  05/11/2008     ICD Implanting MD:  Sherryl Manges, MD  Lead 1:    Location: RA     DOI: 05/11/2008     Model #: 1308     Serial #: [id number]     Status: active Lead 2:    Location: RV     DOI: 05/11/2008     Model #: [id number]     Serial #: [id number]     Status: active Lead 3:    Location: LV     DOI: 05/11/2008     Model #: [id number]     Serial #: [id number]     Status: active  Indications::  ICM   ICD Follow Up ICD Dependent:  No       ICD Device Measurements Configuration: LV RING TO RV COIL  Episodes Coumadin:  No  Brady Parameters Mode DDDR     Lower Rate Limit:  70     Upper Rate Limit 110 PAV 250     Sensed AV Delay:  220  Tachy Zones VF:  207     VT:  171

## 2010-04-10 ENCOUNTER — Encounter (INDEPENDENT_AMBULATORY_CARE_PROVIDER_SITE_OTHER): Payer: MEDICARE

## 2010-04-10 ENCOUNTER — Encounter: Payer: Self-pay | Admitting: Internal Medicine

## 2010-04-10 DIAGNOSIS — I428 Other cardiomyopathies: Secondary | ICD-10-CM

## 2010-04-17 NOTE — Procedures (Signed)
Summary: PACER/AMD/NR   Current Medications (verified): 1)  Citalopram Hydrobromide 40 Mg Tabs (Citalopram Hydrobromide) .Marland Kitchen.. 1 By Mouth Once Daily 2)  Isosorbide Mononitrate Cr 30 Mg Xr24h-Tab (Isosorbide Mononitrate) .... Take One Tablet By Mouth Daily 3)  Fenofibrate Micronized 200 Mg Caps (Fenofibrate Micronized) .Marland Kitchen.. 1 By Mouth At Bedtime 4)  Metoprolol Tartrate 50 Mg Tabs (Metoprolol Tartrate) .... Take 1 in The Am and 1/2 At Bedtime 5)  Nitroglycerin 0.4 Mg Subl (Nitroglycerin) .... One Tablet Under Tongue Every 5 Minutes As Needed For Chest Pain---May Repeat Times Three 6)  Simvastatin 40 Mg Tabs (Simvastatin) .... Take One Tablet By Mouth Daily At Bedtime 7)  Oxycodone-Acetaminophen 10-325 Mg Tabs (Oxycodone-Acetaminophen) .... As Needed 8)  Zolpidem Tartrate 10 Mg Tabs (Zolpidem Tartrate) .Marland Kitchen.. 1 By Mouth At Bedtime 9)  Furosemide 40 Mg Tabs (Furosemide) .... Take 1 By Mouth Two Times A Day 10)  Omeprazole 20 Mg Cpdr (Omeprazole) .Marland Kitchen.. 1 By Mouth Bid 11)  Senokot S 8.6-50 Mg Tabs (Sennosides-Docusate Sodium) .... 2 By Mouth At Bedtime 12)  Vitamin D 1000 Unit Tabs (Cholecalciferol) .Marland Kitchen.. 1 By Mouth Once Daily 13)  Fish Oil 1200 Mg Caps (Omega-3 Fatty Acids) .Marland Kitchen.. 1 By Mouth Two Times A Day 14)  Centrum Silver  Tabs (Multiple Vitamins-Minerals) .Marland Kitchen.. 1 By Mouth Once Daily 15)  Glucosamine-Chondroitin  Caps (Glucosamine-Chondroit-Vit C-Mn) .Marland Kitchen.. 1 By Mouth Once Daily 16)  Coenzyme Q10 10 Mg Caps (Coenzyme Q10) .Marland Kitchen.. 1 By Mouth Once Daily 17)  Aspirin 81 Mg Tbec (Aspirin) .... Take One Tablet By Mouth Daily 18)  Tums 500 Mg Chew (Calcium Carbonate Antacid) .Marland Kitchen.. 1 By Mouth Once Daily As Needed 19)  Lorazepam 1 Mg Tabs (Lorazepam) .... As Needed 20)  Gabapentin 300 Mg Caps (Gabapentin) .... Up To 3 Tabs 3-4 Times Daily 21)  Oxycontin 30 Mg Xr12h-Tab (Oxycodone Hcl) .... Two Times A Day  Allergies (verified): No Known Drug Allergies    ICD Specifications Following MD:  Sherryl Manges, MD      Referring MD:  [facility] ICD Vendor:  [employer]     ICD Model Number:  [id number]     ICD Serial Number:  [id number] ICD DOI:  05/11/2008     ICD Implanting MD:  Sherryl Manges, MD  Lead 1:    Location: RA     DOI: 05/11/2008     Model #: 2956     Serial #: [id number]     Status: active Lead 2:    Location: RV     DOI: 05/11/2008     Model #: 6962     Serial #: [id number]     Status: active Lead 3:    Location: LV     DOI: 05/11/2008     Model #: [id number]     Serial #: [id number]     Status: active  Indications::  ICM   ICD Follow Up Remote Check?  No Battery Voltage:  3.05 V     Charge Time:  9.3 seconds     Underlying rhythm:  Brady @ 45 ICD Dependent:  No       ICD Device Measurements Atrium:  Amplitude: 0.5 mV, Impedance: 475 ohms, Threshold: 0.75 V at 4 msec Right Ventricle:  Amplitude: 8.3 mV, Impedance: 437 ohms, Threshold: 1.5 V at 0.4 msec Left Ventricle:  Impedance: 285 ohms, Threshold: 1.0 V at 0.4 msec Configuration: LV RING TO RV COIL Shock Impedance: 39/48 ohms   Episodes Coumadin:  No Shock:  0  ATP:  0     Nonsustained:  1     Atrial Pacing:  99.7%     Ventricular Pacing:  99.7%  Brady Parameters Mode DDDR     Lower Rate Limit:  70     Upper Rate Limit 110 PAV 250     Sensed AV Delay:  220  Tachy Zones VF:  207     VT:  171     Next Cardiology Appt Due:  07/01/2010 Tech Comments:  No parameter changes.  Device function normal.  Optivol and thoracic impedance abnormal 12/27-1/30 with thoracic impedance below threshold.   Patient denies any swelling or SOB.  No Carelink @ this time.  ROV 3 months with Dr. Graciela Husbands in [place]. Altha Harm, LPN  April 10, 2010 10:35 AM

## 2010-05-08 NOTE — Cardiovascular Report (Signed)
Summary: Office Visit   Office Visit   Imported By: Roderic Ovens 04/28/2010 15:39:51  _____________________________________________________________________  External Attachment:    Type:   Image     Comment:   External Document

## 2010-06-12 LAB — CARDIAC PANEL(CRET KIN+CKTOT+MB+TROPI)
CK, MB: 3.5 ng/mL (ref 0.3–4.0)
CK, MB: 3.7 ng/mL (ref 0.3–4.0)
Total CK: 190 U/L (ref 7–232)
Total CK: 195 U/L (ref 7–232)
Troponin I: 0.06 ng/mL (ref 0.00–0.06)

## 2010-06-12 LAB — BASIC METABOLIC PANEL
CO2: 24 mEq/L (ref 19–32)
Calcium: 8.8 mg/dL (ref 8.4–10.5)
Calcium: 9.1 mg/dL (ref 8.4–10.5)
Creatinine, Ser: 1.4 mg/dL (ref 0.4–1.5)
Creatinine, Ser: 1.62 mg/dL — ABNORMAL HIGH (ref 0.4–1.5)
GFR calc Af Amer: 51 mL/min — ABNORMAL LOW (ref >60)
GFR calc Af Amer: >60 mL/min (ref >60)
GFR calc non Af Amer: 42 mL/min — ABNORMAL LOW (ref >60)
GFR calc non Af Amer: 50 mL/min — ABNORMAL LOW (ref >60)
Glucose, Bld: 110 mg/dL — ABNORMAL HIGH (ref 70–99)
Sodium: 138 mEq/L (ref 135–145)
Sodium: 139 mEq/L (ref 135–145)

## 2010-06-12 LAB — CBC
Hemoglobin: 11.5 g/dL — ABNORMAL LOW (ref 13.0–17.0)
MCHC: 34.3 g/dL (ref 30.0–36.0)
RBC: 3.45 MIL/uL — ABNORMAL LOW (ref 4.22–5.81)
RDW: 13.4 % (ref 11.5–15.5)

## 2010-07-01 ENCOUNTER — Ambulatory Visit (INDEPENDENT_AMBULATORY_CARE_PROVIDER_SITE_OTHER): Payer: Medicare Other | Admitting: Internal Medicine

## 2010-07-01 ENCOUNTER — Encounter: Payer: Self-pay | Admitting: Internal Medicine

## 2010-07-01 DIAGNOSIS — I2589 Other forms of chronic ischemic heart disease: Secondary | ICD-10-CM

## 2010-07-01 DIAGNOSIS — I428 Other cardiomyopathies: Secondary | ICD-10-CM

## 2010-07-01 DIAGNOSIS — I5022 Chronic systolic (congestive) heart failure: Secondary | ICD-10-CM

## 2010-07-01 DIAGNOSIS — I255 Ischemic cardiomyopathy: Secondary | ICD-10-CM

## 2010-07-01 DIAGNOSIS — Z95810 Presence of automatic (implantable) cardiac defibrillator: Secondary | ICD-10-CM

## 2010-07-01 DIAGNOSIS — I498 Other specified cardiac arrhythmias: Secondary | ICD-10-CM

## 2010-07-01 DIAGNOSIS — G4733 Obstructive sleep apnea (adult) (pediatric): Secondary | ICD-10-CM

## 2010-07-01 NOTE — Patient Instructions (Signed)
Your physician recommends that you schedule a follow-up appointment in: 3 months with Randall David  

## 2010-07-01 NOTE — Assessment & Plan Note (Signed)
Declines further therapy

## 2010-07-01 NOTE — Progress Notes (Signed)
HPI  Randall David is a 77 y.o. male Seen in followup ischemic cardiomyopathy depressed left ventricular function congestive heart heart failure and previously implanted CRT-D.  His biggest complaint remains fatigued. He has decided not to pursue therapy for her sleep apnea.  His breathing is stable there is no peripheral edema and no significant chest pain. Ultrasound about a year ago demonstrated ejection fraction of 20-25% there is moderate regurgitation the tricuspid valve with right atrial enlargement with right ventricular systolic dysfunction  Past Medical History  Diagnosis Date  . Ischemic cardiomyopathy     atrial arrhythmias  . CHF (congestive heart failure)   . Acute on chronic systolic heart failure   . Symptoms involving cardiovascular system   . Automatic implantable cardiac defibrillator in situ   . Hyperlipidemia   . Hypertension   . Unspecified disorder resulting from impaired renal function   . Osteoarthrosis, unspecified whether generalized or localized, unspecified site   . Esophageal reflux   . Obstructive sleep apnea (adult) (pediatric)     Past Surgical History  Procedure Date  . Insert / replace / remove pacemaker     CRT-D  . Coronary artery bypass graft   . Total hip arthroplasty     Current Outpatient Prescriptions  Medication Sig Dispense Refill  . aspirin 81 MG EC tablet Take 81 mg by mouth daily.        . calcium carbonate (TUMS - DOSED IN MG ELEMENTAL CALCIUM) 500 MG chewable tablet Chew 1 tablet by mouth daily.        . cholecalciferol (VITAMIN D) 1000 UNITS tablet Take 1,000 Units by mouth daily.        . citalopram (CELEXA) 20 MG tablet Take 20 mg by mouth daily.        Marland Kitchen co-enzyme Q-10 30 MG capsule Take 30 mg by mouth 3 (three) times daily.        . fenofibrate micronized (LOFIBRA) 200 MG capsule Take 200 mg by mouth daily before breakfast.        . furosemide (LASIX) 40 MG tablet Take 40 mg by mouth daily.       . isosorbide  mononitrate (IMDUR) 30 MG 24 hr tablet Take 30 mg by mouth daily.        Marland Kitchen LORazepam (ATIVAN) 1 MG tablet Take 1 mg by mouth every 8 (eight) hours.        . metoprolol (LOPRESSOR) 50 MG tablet 50 mg. Take one tablet in the am and 1/2 at bedtime.       . Misc Natural Products (GLUCOSAMINE CHONDROITIN ADV) TABS Take by mouth.        . Multiple Vitamins-Minerals (CENTRUM SILVER PO) Take by mouth.        . nitroGLYCERIN (NITROSTAT) 0.4 MG SL tablet Place 0.4 mg under the tongue every 5 (five) minutes as needed.        . Omega-3 Fatty Acids (FISH OIL) 1200 MG CAPS Take 1,200 mg by mouth 2 (two) times daily.        Marland Kitchen omeprazole (PRILOSEC) 20 MG capsule Take 20 mg by mouth daily.        Marland Kitchen oxycodone (OXYCONTIN) 30 MG TB12 Take 30 mg by mouth 2 (two) times daily.        Marland Kitchen oxyCODONE-acetaminophen (PERCOCET) 10-325 MG per tablet Take 1 tablet by mouth every 4 (four) hours as needed.        . sennosides-docusate sodium (SENOKOT-S) 8.6-50 MG tablet Take 1 tablet  by mouth daily.        . simvastatin (ZOCOR) 80 MG tablet Take 40 mg by mouth at bedtime.       Marland Kitchen zolpidem (AMBIEN) 10 MG tablet Take 10 mg by mouth at bedtime as needed.        . gabapentin (NEURONTIN) 300 MG capsule Take 300 mg by mouth 3 (three) times daily.          No Known Allergies  Review of Systems negative except from HPI and PMH  Physical Exam Well developed and somewhat malnourished appearing in no acute distress HENT normal E scleral and icterus clear Neck Supple JVP flat; carotids brisk and full Clear to ausculation Regular rate and rhythm, 2/6 murmur at the apex Soft with active bowel sounds No clubbing cyanosis and edema Alert and oriented, grossly normal motor and sensory function Skin Warm and Dry Affect is flat  ECG  Assessment and  Plan

## 2010-07-01 NOTE — Assessment & Plan Note (Signed)
No intercurrent atrial arrhythmias

## 2010-07-01 NOTE — Assessment & Plan Note (Signed)
The patient's device was interrogated.  The information was reviewed. No changes were made in the programming.    

## 2010-07-01 NOTE — Assessment & Plan Note (Signed)
Stable on current meds. I don't know that they can do anything further with his device.

## 2010-07-01 NOTE — Assessment & Plan Note (Signed)
Stable without chest pain; continue current medications. I recommended that his simvastatin be decreased from 80-40 mg a day

## 2010-07-15 NOTE — Discharge Summary (Signed)
NAMEDAVELLE, ANSELMI                ACCOUNT NO.:  [id number]   MEDICAL RECORD NO.:  [id number]          PATIENT TYPE:  INP   LOCATION:  2921                         FACILITY:  [facility]   PHYSICIAN:  Duke Salvia, MD, FACCDATE OF BIRTH:  [birth year]   DATE OF ADMISSION:  05/11/2008  DATE OF DISCHARGE:  05/14/2008                               DISCHARGE SUMMARY    This patient has no known drug allergies.   Time for this dictation and exam and explanation to the patient, greater  than 50 minutes.   FINAL DIAGNOSES:  1. Discharging day 3 status post implant of a Medtronic CONCERTO II      biventricular implantable cardioverter-defibrillator (CRT-D).      a.     History of ischemic cardiomyopathy, ejection fraction 20% at       [facility] study in December 2009.      b.     New York Heart Association Class III chronic systolic       congestive heart failure.      c.     Left bundle-branch block.  2. Atrial tachycardia post implant.      a.     The patient will be discharged on amiodarone load and       maintenance in sinus rhythm.   PAST MEDICAL HISTORY:  1. Three-vessel coronary artery disease status post coronary artery      bypass graft surgery in 1985, redo CABG in 1999, second redo in      2007.  2. Myoview study in December 2009, ejection fraction 28%, fixed      inferoposterolateral infarct.  3. Hypertension.  4. Dyslipidemia.  5. Obstructive sleep apnea.  6. GERD/peptic ulcer disease.  7. Degenerative joint disease.  8. The patient has had 3 hip surgeries.  9. Moderate mitral regurgitation, moderate tricuspid regurgitation.   PROCEDURE:  On May 11, 2008, implant of a Medtronic CONCERTO II  cardioverter-defibrillator with cardiac resynchronization therapy, left  ventricular lead, Dr. Sherryl Manges.  The patient has had no  postprocedural complications, such as, hematoma or a wire displacement.   BRIEF HISTORY:  Mr. Coffield is a 77 year old male.  He has had a long-  standing history of 3-vessel coronary artery disease.  He has undergone  3 separate coronary artery bypass graft surgery.  His last was in 2007.  He also has significant impairment of daily activities with [place]  Heart Association Class III congestive heart failure.  He does at times  have some limits to his activity with anginal pain for which he takes  NitroQuick.  His last Myoview study was in December 2009.  It showed  ejection fraction of 28% with an inferoposterolateral infarct in place.  The patient also has left bundle-branch block.  He is referred for  cardioverter-defibrillator with left ventricular cardiac  resynchronization therapy.   The patient says that he walks less than 100 feet before becoming short  of breath and once again mentions that he at times takes nitroglycerin  sublingually for anginal pain.   Mr. Fedak certainly qualifies for implant of a  cardioverter-  defibrillator.  There is some evidence of wide-complex QRS and it is  accompanying exercise debilitation can be remediated by left ventricular  pacing.  The potential benefits and risks have been discussed with the  patient, and he wishes to proceed.  We are going to schedule this at the  first available opportunity.   HOSPITAL COURSE:  The patient presents on May 11, 2008.  He underwent  implantation of the Medtronic CONCERTO II cardioverter-defibrillator.  Left ventricular lead was placed.  He did have some diaphragmatic  stimulation but is not complaining of this at this time.  He also was  begun on amiodarone for recurrent atrial tachycardia.  At the time of  discharge, he is in sinus rhythm.  He has had interrogation of his  device, which shows all values within normal limits.  Chest x-ray has  been examined, the leads are in appropriate position.  Mobility of the  left arm and incision care have been discussed with the patient.  The  patient has Percocet at home for musculoskeletal degenerative  joint  disease pain, and this should take care of his incision discomfort as  well.  He is asked at discharge to keep his incision dry for the next 5  days and to sponge bathe until Friday, May 18, 2008.   MEDICATIONS AT DISCHARGE:  1. Citalopram 40 mg daily.  2. Isosorbide mononitrate 30 mg daily.  3. Fenofibrate 200 mg daily.  4. Metoprolol 25 mg in the morning/50 mg in the evening.  5. Simvastatin 80 mg daily at bedtime.  6. Omeprazole 20 mg daily.  7. Fish oil capsules 1 g twice daily.  8. Multivitamin daily.  9. Enteric-coated aspirin 81 mg daily.  10.Alprazolam 0.25 mg every 8 hours.  11.Ambien 10 mg daily at bedtime.  12.NitroQuick 0.4 mg sublingually as needed.  13.Percocet 5/325 one to two tablets every 4-6 hours as needed.   NEW MEDICATION:  Amiodarone 200 mg tablets 2 tablets in the morning, 2  tablets in the evening, from May 14, 2008, to May 28, 2008, (2  weeks) then to start 2 tablets daily on Tuesday, May 29, 2008.   The patient also has the following medications:  1. Vitamin D 1000 mg daily.  2. Glucosamine chondroitin 1500 mg daily.  3. Coenzyme Q10 300 mg daily.   1. He has a followup at [facility] at the [facility] on      Thursday, May 17, 2008, at 8:30 for a complete pulmonary function      studies, and he is to eat a light breakfast.  2. ICD Clinic at [facility] on Monday, May 21, 2008, at 8 o'clock.  3. To see Dr. Graciela Husbands on Monday, June 11, 2008, at 10:45 at [facility], and the patient is to call to make an      appointment with Dr. Arnoldo Hooker in 4 weeks.  This will be      transmitted to the patient prior to his discharge.   LABORATORY STUDIES:  Pertinent to this admission on May 13, 2008,  hemoglobin 11.5, hematocrit 33.5, white cells 6.7, platelets 143.  Serum  electrolytes on May 13, 2008, sodium 138, potassium is 4, chloride  106, carbonate 24, BUN is 24, creatinine  1.62, glucose 110.  Troponin I  studies on this admission 0.04, 0.05, then 0.06.      Maple Mirza, PA  Duke Salvia, MD, [facility]  Electronically Signed    GM/MEDQ  D:  05/14/2008  T:  05/15/2008  Job:  [id number]   cc:   Jerl Mina  Dr. Arnoldo Hooker

## 2010-07-15 NOTE — Op Note (Signed)
NAMEHODARI, Randall David                ACCOUNT NO.:  [id number]   MEDICAL RECORD NO.:  [id number]          PATIENT TYPE:  INP   LOCATION:  4714                         FACILITY:  [facility]   PHYSICIAN:  Duke Salvia, MD, FACCDATE OF BIRTH:  1932/06/07   DATE OF PROCEDURE:  05/11/2008  DATE OF DISCHARGE:                               OPERATIVE REPORT   PREOPERATIVE DIAGNOSES:  Ischemic cardiomyopathy, congestive heart  failure, left bundle-branch block, and atrial tachycardia.   POSTOPERATIVE DIAGNOSES:  Ischemic cardiomyopathy, congestive heart  failure, left bundle-branch block, and atrial tachycardia.   PROCEDURES:  Implantation of a dual-chamber defibrillator with left  ventricular lead placement, intraoperative defibrillation threshold  testing,  accompanied by cardioversion.   Following obtaining informed consent, the patient was brought to the  electrophysiology laboratory and placed on the fluoroscopic table in a  supine position.  After routine prep and drape of the left upper chest,  lidocaine was infiltrated in the prepectoral and subclavicular region.  An incision was made and carried down to layer of the prepectoral fascia  using electrocautery and sharp dissection, a pocket was performed  similarly.  Hemostasis was obtained.   Thereafter attention was turned to gaining access to the extrathoracic  left subclavian vein, which was accomplished without difficulty.  Three  separate venipunctures were accomplished.  Guidewires were placed and  retained.  Sequentially, a Medtronic Sprint Quattro [id number], 65-cm lead,  serial number [id number], a Medtronic MB2 coronary sinus cannulation  catheter, and a Medtronic 5076, 52-cm fixation atrial lead, serial  number [id number], were placed.   The RV lead was deployed to the right ventricular apex where the bipolar  R-wave was 5 mV with a pace impendence of 610, a threshold of 0.5 volts  at 0.5 milliseconds.  Current at threshold was 1.3  mA.  Current of  injury was modest.  This lead was secured to the prepectoral fascia.   We then used the MB2 with a Wholey wire to cannulate the coronary sinus  and then because of the patient's renal insufficiency, we used 1 to 3  contrast and tried to minimize her pictures.  The initial picture  demonstrated a mid lateral branch and an occlusive venogram confirmed a  small lateral branch, nothing else was seen, although we did know that  there was bail-out branch.   We then used a Whisper wire and deployed a Medtronic [id number], 88-cm  coronary sinus lead, serial [id number].  We were able to cannulate this  lateral branch initially quite quickly and we placed the lead into the  branch.  Unfortunately, in all but its most distal position which was  not able to maintain, there was some diaphragmatic stimulation in the  bipolar, unipolar, proximal unipolar distal modes.  There had been an  inferiorly directed branch off this vein and we tried to find it but in  attempting to do so, I lost contact with this vein.  I then tried to see  if there is something high which I had not appreciated before and with  repeat venography after initial blind mapping saw no branches  of.  We  then decided to go back to this branch and I had more difficulty  cannulating it.  We ended up using an Attain II, 90-degree catheter and  needed to do another occlusive venogram in the RAO projection to  identify its anterior takeoff.  We then took the lead, put it back in  its most distal ramification and in this location, the bipolar L-wave  was 1.9, with a pace impedance of 472, a threshold of 1.7 at 0.5,  current at threshold was 3.2 mA, and there was diaphragmatic pacing  intermittently at 10 volts in the proximal unipolar configuration.  The  2 sheaths were removed sequentially, stability confirmed, and the lead  was secured to the prepectoral fascia.  In point of fact, what I have  done is I put the LV lead in  position, I then put the RA lead in  position, and I then went back to the LV lead and  mucked around, much  to my dismay.  In any case, the RA lead was deployed into the base of  the right atrial appendage where bipolar flutter wave was 1.3 mV with a  pace impedance of 643 ohms.  I did did say flutter wave, but the  atrial  cycle length was about 380 milliseconds.   Pocket was copiously irrigated with antibiotic obtaining saline  solution.  After all these leads were secured, hemostasis was obtained  and the leads were attached to a Concerto II CRT-D ICD, serial number  [id number].  The device model was 274TRK.  Through the device, bipolar  tachycardia wave was 0.6 with an impedance of 532.  The R-wave was 4.6  with an impedance of 475, a threshold of 1 volt at 0.2, and the LV  impedance was 912 with a threshold 2.5 at 0.5.  At this point,  defibrillation threshold testing was undertaken.  The issue was whether  to defibrillate him as I was sure that it was going to cardiovert him at  the same time.  The decision to proceed was based on the cycle length of  the atrial rhythm at about 380 milliseconds or so with the observation  that we would anticipate atrial appendage function at this cycle length.  That having been decided, ventricular fibrillation was induced via the T-  wave shock.  After a total duration of 8 seconds, a 15-joule shock was  delivered through measured resistance of 41 ohms, terminating  ventricular fibrillation and atrial tachycardia and restoring sinus  rhythm.  The sinus rhythm was in fact very slow and he was AV-paced.  The pocket was copiously irrigated with antibiotic containing saline  solution.  Hemostasis was assured.  Surgicel was used on the posterior,  anterior,  and cephalad aspect of the pockets.  The device was secured  to the prepectoral fascia and the wound was closed in 3 layers in normal  fashion.  The wound was washed, dried, and a benzoin and  Steri-Strip  dressing was applied.  Needle count, sponge count, and instrument counts  were correct at the end of procedure according to staff.  The patient  tolerated the procedure without apparent complication.       Duke Salvia, MD, [facility]  Electronically Signed     SCK/MEDQ  D:  05/11/2008  T:  05/12/2008  Job:  [id number]   cc:   Arnoldo Hooker, MD  [facility]

## 2010-07-15 NOTE — Letter (Signed)
April 30, 2008    Arnoldo Hooker, M.D.  [address]  [place], [place] [address]   RE:  Randall, David  MRN:  [id number]  /  DOB:  Jul 16, 1932   Dear Smitty Cords:   It was a pleasure seeing Randall David today at your request for  consideration of CRT-D implantation.   As you know, he is a 77 year old gentleman with a terrible history of  coronary disease undergoing bypass surgery initially in 1985 with redo  in 1999 and redo again in 2007.  The post-procedure course most recently  has been complicated by chronic exertional chest discomfort.  He also  has significant concomitant shortness of breath so that he has become  limited to walking probably 100 yards or less.  He has some nocturnal  dyspnea.  No orthopnea and occasional peripheral edema.   He has had no palpitations and no syncope.   A couple years ago, he did have some bad these spells that sounded like  vertigo where he was actually falling out of his bed.   Cardiac evaluation recently has been very helpful in elucidating his  current situation.  In December, he underwent Myoview scanning which  demonstrated significant LV dysfunction with ejection fraction of 28%  and fixed inferoposterolateral defects without evidence of scar.  He  also had some atrial fibrillation at peak exercise.   His echo done around that same time confirmed a poor ejection fraction  with moderate MR and TR and mild atrial enlargement.   PAST MEDICAL HISTORY:  In addition to the above, is notable for:  1. Hypertension.  2. Hyperlipidemia.  3. Sleep apnea.  4. GE reflux disease/peptic ulcer disease.  5. DJD.   In addition to his bypass surgery, he has had 3 hip surgeries.   SOCIAL HISTORY:  He is married.  He has 3 children, 9 grandchildren, and  3 great-grandchildren.  He does not use cigarettes or recreational  drugs.  He does drink alcohol occasionally.  He is retired from [employer].  His wife also worked here in  [place] after  they moved here from [place].  He does use coffee.   REVIEW OF SYSTEMS:  In addition to the above, is notable for dentures,  arthritis, anxiety, and depression and is otherwise negative across all  organ systems.   FAMILY HISTORY:  Noncontributory.   MEDICATIONS:  Include:  1. Isosorbide 30.  2. Fenofibrate 200.  3. Metoprolol 25/50.  4. Simvastatin 80.  5. Protonix 40.  6. Omeprazole 20.  7. Fish oil.  8. Aspirin 81.   HE HAS NO KNOWN DRUG ALLERGIES.   EXAMINATION:  He is an elderly Caucasian male appearing his stated age  of 77.  His blood pressure is 123/74.  His pulse was 82.  His weight was  198.  HEENT EXAM:  Demonstrated no icterus or xanthomata.  NECK:  Veins were flat.  The carotids were brisk and full bilaterally  without bruits.  BACK:  Without kyphosis or scoliosis.  LUNGS:  Clear.  HEART:  Sounds were regular without gallops.  There was a 3/6 murmur  heard at the apex.  ABDOMEN:  Soft with active bowel sounds without midline pulsation or  hepatomegaly.  Femoral pulses were 2+.  Distal pulses were intact.  There was no  clubbing or cyanosis.  There was 2+ edema on the left and 1+ edema on  the right.  NEUROLOGICAL EXAM:  Grossly normal although his affect was somewhat  flat.   Electrocardiogram that you sent from your office dated February 01, 2008,  demonstrated sinus rhythm at 75 with intervals of 0.22/0.16/0.44 with a  left bundle branch block/IVCD pattern.   IMPRESSION:  1. Congestive heart failure - Chronic - Systolic.      a.     Ischemic heart disease with bypass surgery and redo.      b.     Redo most recently in 2007.      c.     Chronic recurrent chest pain .      d.     Ejection fraction of 25% to 30%.      e.     Moderate mitral and tricuspid regurgitation.  2. Atrial fibrillation seen at peak exercise, currently not on      Coumadin.  3. Left bundle branch block.   Bruce, Mr. Cothran certainly fits into a high-risk  group for sudden  cardiac death based on his QRS duration and ejection fraction and  interval since his first heart attack.   I agree with your thought that ICD risk reduction could be accomplished  by ICD implantation.  In addition, I agree that his functional  limitations might be improved by resynchronization.  I think it is a  little bit of an unknown as to how well he might respond given the  following:  1. He has a large inferoposterolateral infarct and so we will need to      try and put his lead up on the anterolateral wall.  2. Patients with IVDs have less benefit than patients with left bundle-      branch block although while in the precordial leads it looks like      an IVCD.  In the limb leads, it looks left bundle-ish  3. I am not sure whether angina is the cause of his shortness of      breath or not.  However, having said that, the initial studies with      resynchronization demonstrated that performance could be improved      without increase in MVO2 so even if we can just improve performance      we may be able to get more before he triggers his anginal      threshold.  This progressed to another question, that is, are there      other medications such as calcium blockers, Ranexa, or perhaps      higher doses of nitrates that may help augment cardiac performance.   I will look forward to talking to you about it and referring to your  expertise but having reviewed the above he and his wife are interested  in pursuing cardiac resynchronization therapy.  I have discussed with  them the potential benefits, as well as the potential risks including  but not limited to death, infection, inappropriate shocks, lead  dislodgement, and device malfunction.  They understand these risks and  would like to proceed.   We will plan to go ahead and do that.  Thank you very much for allowing  [place] to participate in his care and look forward to talking with you about  him.    Fondly,    Sincerely,      Randall Salvia, MD, [facility]  Electronically Signed    SCK/MedQ  DD: 04/30/2008  DT: 04/30/2008  Job #: [id number]   CC:    Jerl Mina

## 2010-07-15 NOTE — Letter (Signed)
September 10, 2008    Rudi Coco, RN, MSN, [facility]  [address]  [place], [place] [address]   RE:  Randall David, Randall David  MRN:  [id number]  /  DOB:  06/29/1932   Dear Lupita Leash,   Mr. Cerrone comes in today with the complaint that you had noted last  week.  As you know, he has been having falling spells.  It sounds like  there are 2 different types of spells.  The first is one that occurs  while he is lying flat and he feels like he is being thrown off of his  bed, he has landed on the floor, a couple of times he had to hold onto  his mattress.   The other episodes occur where he falls without warning.  This has  occurred stepping out of the shower, walking along in the house.  This  is unassociated with a sense of spinning.   It is his wife and his impression that this is worse since the  amiodarone was initiated, which was used for an atrial tachycardia that  was identified at the time of his CRT-D implantation.   In the setting of his depressed left ventricular function, it was felt  to be the best medication.   He denies intercurrent problems with chest pain or shortness of breath.  The patient also has complaints of peripheral edema.  Apparently,  attempts by Dr. Burnett Sheng to reduce this by increasing his Lasix to 80  b.i.d. for 5 days had no impact.   CURRENT MEDICATIONS:  1. Metoprolol 25/50.  2. Omeprazole.  3. Simvastatin.  4. Percocet.  5. Imdur at night.  6. Hydralazine and Pacerone had been intercurrently discontinued.   PHYSICAL EXAMINATION:  GENERAL:  He is an elderly Caucasian male  appearing his stated age of 16.  Orthostatics today were obtained and  they were surprisingly flat with only minimal dizziness.  NECK:  His neck veins were 7 cm.  His carotids were brisk.  He is alert  and oriented.  SKIN:  Warm and dry.  BACK:  Without kyphosis.  LUNGS:  Clear.  HEART:  Sounds were regular without murmurs.  ABDOMEN:  Soft  and nontender.  Bowel sounds are present.  EXTREMITIES:  1-2+ edema.   Interrogation of his Medtronic CRT-D device demonstrates a deteriorating  activity index.  There has been no significant intercurrent atrial  arrhythmias.  His OptiVol index is also flat.  His heart rate excursion  is adequate.   IMPRESSION:  1. Ischemic cardiomyopathy.  2. Congestive heart failure secondary to ischemic cardiomyopathy.  3. Status post cardiac resynchronization therapy defibrillator for the      above.  4. Atrial tachycardia.  5. Pacerone related to atrial tachycardia.  6. Recent problem with falls of 2 different syndrome complexes.      a.     Supine that sounds like vertigo.      b.     Upright that may or may not be the same.  7. Peripheral edema.   Lupita Leash, Mr. Gahm has a series of problems and I am not quite sure to  what degree amiodarone is the culprit.  However, I think your concern,  that it might be, is valid, and I agree with your having discontinued  his amiodarone.  We will need to wait and see to what degree his  symptoms abate.  In the event that they do not, I would have him follow  up with Dr. Burnett Sheng and consider a neurological referral.   As related to his edema, I have taken the liberty of having him to take  120 mg of Lasix in the morning for 5 days to see if we can make some  headway there.  In the event that does not resolve, I have asked him to  follow back up with you.   We will see him again in 3 months' time, keeping an eye on the frequency  of his atrial arrhythmia for which the amiodarone was started in the  first place.  The other thing we might be able to do is to consider AV  optimization for his CRT-D device.    Sincerely,      Duke Salvia, MD, [facility]  Electronically Signed    SCK/MedQ  DD: 09/10/2008  DT: 09/11/2008  Job #: [id number]   CC:    Jerl Mina

## 2010-07-15 NOTE — Letter (Signed)
June 11, 2008    Lamar Blinks, M.D.  [address]  [place], [place] [address]   RE:  LEBARON, BAUTCH  MRN:  [id number]  /  DOB:  12/18/1932   Dear Smitty Cords:   I hope this letter finds you well.  Mr. Clopper comes in today feeling  better that he was a couple of weeks ago following diuresis.  He had  been considerably worse as I had mentioned to you before after his CRT  device went in.  I think that was a course complicated by congestive  heart failure.  Unfortunately, it has also been troublesome because of  his renal insufficiency.  His preoperative creatinine went from 1.7-1.98  dated March 22 and we have a repeat laboratory ordered for today.  We  have diuresed him about 6 or 8 pounds in the intervening 3 weeks and he  is feeling a good deal better, in fact, feeling better than he did prior  to his procedure.   Review of his medications demonstrates:  1. Isosorbide 30 in the absence of hydralazine.  2. Metoprolol 25/50.  3. Amiodarone at 200 mg twice daily.  He is also notably not on Coumadin.   EXAMINATION:  His blood pressure today was 122/70.  His pulse was 75.  His neck veins were 8-9 cm.  LUNGS:  Were clear.  HEART:  Sounds were regular.  ABDOMEN:  Was soft.  EXTREMITIES:  Were with 1+ edema on the right and 2+ edema left.  SKIN:  Was warm and dry.  NEUROLOGICAL:  Exam was grossly normal.   His device was interrogated but not addressed today except to note that  he is 100% ventricularly paced.  His heart rate excursion was okay.  I  should note that his A sense V sense interval is very short at baseline  even though his PR interval is not so short.   IMPRESSION:  1. Congestive heart failure.  2. Status post CRT-D for the above.  3. Atrial tachycardia status post cardioversion on amiodarone.  4. Renal insufficiency with a somewhat worse creatinine.   Bruce, Mr. Quadros is better following diuresis.  I wonder what impact  it has had on his kidneys  and I have drawn a BMET today so you should  have that for your information for your visit tomorrow.  I have related  to the family the potential concerns regarding diuresis, renal  insufficiency.   In addition, for right now we will keep him on his Pacerone but think  about down titrating it at his next visit if he had no intercurrent  atrial arrhythmias.  As I recall his atrial rate was sufficiently slow  that we did not feel that Coumadin was unnecessary concomitant.   If there is anything further I can do, please do not hesitate contact  me.    Sincerely,      Duke Salvia, MD, [facility]  Electronically Signed    SCK/MedQ  DD: 06/11/2008  DT: 06/11/2008  Job #: [id number]

## 2010-07-15 NOTE — Letter (Signed)
May 21, 2008    Dr. Arnoldo Hooker  [address]  [place], [place] [address]   RE:  TOLUWANI, YADAV  MRN:  [id number]  /  DOB:  1932-08-09   Dear Smitty Cords:   Mr. Shatto was seen at his request today because of difficulty  breathing and fatigue.  He is 10 days status post CRT-D implantation  that was notable as you recall for the development of an atrial  tachycardia that initially responded to cardioversion and then reverted.  We ended up putting him on amiodarone, kept him over the weekend and  then discharged him a week ago.  Since that time he feels like he has  put on about 5 pounds and he noticed that he is wheezing.  He has some  peripheral edema and noticed some fullness in his neck.  He recalls that  you had discontinued his diuretic previously.   He has had no problems with nausea.   His medications are notable for the intercurrent addition of the  amiodarone currently at 400 mg twice a day.  He is not on diuretic and I  do not have the rest of his medical list right in front of me.   On examination his blood pressure was 130/72.  His pulse was 60.  His  weight was 210.  His neck veins were 10-11 cm.  His lungs had basilar crackles without wheezes.  HEART:  Sounds were regular with a 2/6 murmur that went from the left  lower sternal border, the right upper sternal border and radiated more  faintly out to the apex.  The abdomen was protuberant.  The extremities had 2+ edema on the left  and 1+ edema on the right.   Interrogation of his device demonstrated that his lower rate limit was  programmed at 50 beats per minute and he was 47% pacing at this atrial  rate.   IMPRESSION:  1. Congestive heart failure  -  acute on chronic - systolic.  2. Status post cardiac resynchronization therapy defibrillator      implantation.  3. Atrial tachycardia holding sinus rhythm on amiodarone.  4. Modest bradycardia, question iatrogenic contribution.   Bruce, there  are a number of issues that come up with Mr. Tucker that  may be contributing.  The first is whether his paucity of diuretic is  contributing to that.  His neck veins and his weight certainly suggested  that would be the case so I have resumed Lasix at 40 mg a day.  Apparently, he did not take potassium as a supplement previously.   Secondly his lower rate may be too low.  If he is pacing 50% at 50 beats  per minute and was use to his atrial tachycardia rate at 80, relative  bradycardia may be contributing.  So I have reprogrammed his lower rate  limit to 70.   Amiodarone may also be a contributor here and I have decreased it from  400 b.i.d. to 200 b.i.d.   In the background we also have the issue as to whether his AV delay is  optimized via his device.  However, given the more likely contributed  from the above.  I thought we would leave this one for last.   I will plan to talk to you about him today.  I have asked that he follow  up with [place] by phone later this week to let us know that he is not worse  and that he  should follow up with you as soon as possible within the  week or 10 days or so to make sure that he has  responded to the above  therapy.  I did not get blood work today, so a BNP and BMP at that visit  may be appropriate, but I will defer that to your expertise.   Thanks for allowing [place] participate his care.    Sincerely,      Duke Salvia, MD, [facility]  Electronically Signed    SCK/MedQ  DD: 05/21/2008  DT: 05/21/2008  Job #: [id number]

## 2010-09-24 ENCOUNTER — Ambulatory Visit (INDEPENDENT_AMBULATORY_CARE_PROVIDER_SITE_OTHER): Payer: Medicare Other | Admitting: *Deleted

## 2010-09-24 DIAGNOSIS — I5022 Chronic systolic (congestive) heart failure: Secondary | ICD-10-CM

## 2010-09-24 DIAGNOSIS — I255 Ischemic cardiomyopathy: Secondary | ICD-10-CM

## 2010-09-24 DIAGNOSIS — I2589 Other forms of chronic ischemic heart disease: Secondary | ICD-10-CM

## 2010-09-24 NOTE — Progress Notes (Signed)
icd check with icm in [place] 

## 2010-12-10 ENCOUNTER — Ambulatory Visit (INDEPENDENT_AMBULATORY_CARE_PROVIDER_SITE_OTHER): Payer: Medicare Other | Admitting: *Deleted

## 2010-12-10 ENCOUNTER — Encounter: Payer: Self-pay | Admitting: Internal Medicine

## 2010-12-10 DIAGNOSIS — I5022 Chronic systolic (congestive) heart failure: Secondary | ICD-10-CM

## 2010-12-10 DIAGNOSIS — I255 Ischemic cardiomyopathy: Secondary | ICD-10-CM

## 2010-12-10 DIAGNOSIS — I2589 Other forms of chronic ischemic heart disease: Secondary | ICD-10-CM

## 2010-12-10 LAB — ICD DEVICE OBSERVATION
AL THRESHOLD: 1 V
BAMS-0001: 150 {beats}/min
BATTERY VOLTAGE: 2.9991 V
FVT: 0
PACEART VT: 0
RV LEAD AMPLITUDE: 2.25 mv
RV LEAD THRESHOLD: 1.25 V
TOT-0002: 0
TZAT-0001ATACH: 1
TZAT-0002ATACH: NEGATIVE
TZAT-0002ATACH: NEGATIVE
TZAT-0002ATACH: NEGATIVE
TZAT-0002FASTVT: NEGATIVE
TZAT-0005SLOWVT: 88 pct
TZAT-0005SLOWVT: 91 pct
TZAT-0011SLOWVT: 10 ms
TZAT-0011SLOWVT: 10 ms
TZAT-0012SLOWVT: 200 ms
TZAT-0013SLOWVT: 2
TZAT-0013SLOWVT: 2
TZAT-0018ATACH: NEGATIVE
TZAT-0018ATACH: NEGATIVE
TZAT-0018SLOWVT: NEGATIVE
TZAT-0018SLOWVT: NEGATIVE
TZAT-0019ATACH: 6 V
TZAT-0019ATACH: 6 V
TZAT-0019FASTVT: 8 V
TZAT-0019SLOWVT: 8 V
TZAT-0019SLOWVT: 8 V
TZAT-0020ATACH: 1.5 ms
TZAT-0020FASTVT: 1.5 ms
TZON-0004SLOWVT: 16
TZON-0005SLOWVT: 12
TZST-0001ATACH: 5
TZST-0001FASTVT: 3
TZST-0001FASTVT: 5
TZST-0001SLOWVT: 4
TZST-0001SLOWVT: 6
TZST-0002ATACH: NEGATIVE
TZST-0002FASTVT: NEGATIVE
TZST-0002FASTVT: NEGATIVE
TZST-0002FASTVT: NEGATIVE
TZST-0002FASTVT: NEGATIVE
TZST-0003SLOWVT: 25 J
TZST-0003SLOWVT: 35 J
TZST-0003SLOWVT: 35 J
VF: 0

## 2011-01-01 ENCOUNTER — Ambulatory Visit: Payer: Self-pay | Admitting: Surgery

## 2011-02-25 ENCOUNTER — Emergency Department: Payer: Self-pay | Admitting: Emergency Medicine

## 2011-03-02 ENCOUNTER — Emergency Department: Payer: Self-pay | Admitting: *Deleted

## 2011-03-17 ENCOUNTER — Ambulatory Visit (INDEPENDENT_AMBULATORY_CARE_PROVIDER_SITE_OTHER): Payer: Medicare Other | Admitting: Internal Medicine

## 2011-03-17 ENCOUNTER — Encounter: Payer: Self-pay | Admitting: Internal Medicine

## 2011-03-17 ENCOUNTER — Other Ambulatory Visit: Payer: Self-pay | Admitting: Internal Medicine

## 2011-03-17 DIAGNOSIS — I2589 Other forms of chronic ischemic heart disease: Secondary | ICD-10-CM

## 2011-03-17 DIAGNOSIS — I255 Ischemic cardiomyopathy: Secondary | ICD-10-CM

## 2011-03-17 DIAGNOSIS — I5022 Chronic systolic (congestive) heart failure: Secondary | ICD-10-CM

## 2011-03-17 DIAGNOSIS — Z95810 Presence of automatic (implantable) cardiac defibrillator: Secondary | ICD-10-CM

## 2011-03-17 LAB — ICD DEVICE OBSERVATION
ATRIAL PACING ICD: 96.17 pct
BAMS-0001: 150 {beats}/min
CHARGE TIME: 10.149 s
FVT: 0
PACEART VT: 0
RV LEAD AMPLITUDE: 1.875 mv
RV LEAD THRESHOLD: 0.75 V
TOT-0001: 1
TZAT-0002ATACH: NEGATIVE
TZAT-0002ATACH: NEGATIVE
TZAT-0004SLOWVT: 8
TZAT-0004SLOWVT: 8
TZAT-0005SLOWVT: 88 pct
TZAT-0005SLOWVT: 91 pct
TZAT-0011SLOWVT: 10 ms
TZAT-0011SLOWVT: 10 ms
TZAT-0012ATACH: 150 ms
TZAT-0012ATACH: 150 ms
TZAT-0012FASTVT: 200 ms
TZAT-0012SLOWVT: 200 ms
TZAT-0012SLOWVT: 200 ms
TZAT-0019ATACH: 6 V
TZAT-0019SLOWVT: 8 V
TZAT-0020ATACH: 1.5 ms
TZAT-0020ATACH: 1.5 ms
TZAT-0020ATACH: 1.5 ms
TZAT-0020FASTVT: 1.5 ms
TZON-0003ATACH: 400 ms
TZON-0003SLOWVT: 350 ms
TZST-0001ATACH: 4
TZST-0001FASTVT: 2
TZST-0001SLOWVT: 4
TZST-0002ATACH: NEGATIVE
TZST-0002FASTVT: NEGATIVE
TZST-0002FASTVT: NEGATIVE
TZST-0002FASTVT: NEGATIVE
TZST-0003SLOWVT: 15 J
TZST-0003SLOWVT: 35 J
TZST-0003SLOWVT: 35 J

## 2011-03-17 NOTE — Patient Instructions (Signed)
Follow up with Randall David in 3 months.

## 2011-03-17 NOTE — Assessment & Plan Note (Signed)
Stable, with a caveat above

## 2011-03-17 NOTE — Progress Notes (Signed)
HPI  Randall David is a 78 y.o. male Seen in followup ischemic cardiomyopathy depressed left ventricular function congestive heart heart failure and previously implanted CRT-D.   Continues to suffer with significant fatigue His breathing is stable there is no peripheral edema and no significant chest pain.  Ultrasound 2010 or so demonstrated ejection fraction of 20-25% there is moderate regurgitation the tricuspid valve with right atrial enlargement with right ventricular systolic dysfunction  He has significant pain in his hand following trigger finger surgery yesterday   Past Medical History  Diagnosis Date  . Ischemic cardiomyopathy     atrial arrhythmias  . CHF (congestive heart failure)   . Acute on chronic systolic heart failure   . Symptoms involving cardiovascular system   . Automatic implantable cardiac defibrillator in situ   . Hyperlipidemia   . Hypertension   . Unspecified disorder resulting from impaired renal function   . Osteoarthrosis, unspecified whether generalized or localized, unspecified site   . Esophageal reflux   . Obstructive sleep apnea (adult) (pediatric)     Past Surgical History  Procedure Date  . Insert / replace / remove pacemaker     CRT-D  . Coronary artery bypass graft   . Total hip arthroplasty     Current Outpatient Prescriptions  Medication Sig Dispense Refill  . aspirin 81 MG EC tablet Take 81 mg by mouth daily.        . calcium carbonate (TUMS - DOSED IN MG ELEMENTAL CALCIUM) 500 MG chewable tablet Chew 1 tablet by mouth daily.        . cholecalciferol (VITAMIN D) 1000 UNITS tablet Take 1,000 Units by mouth daily.        . citalopram (CELEXA) 20 MG tablet Take 40 mg by mouth daily.       . fenofibrate micronized (LOFIBRA) 200 MG capsule Take 200 mg by mouth daily before breakfast.        . furosemide (LASIX) 40 MG tablet Take 40 mg by mouth daily.       . metoprolol (LOPRESSOR) 50 MG tablet Take 25 mg by mouth 2 (two) times daily.        . Misc Natural Products (GLUCOSAMINE CHONDROITIN ADV) TABS Take by mouth.        . Multiple Vitamins-Minerals (CENTRUM SILVER PO) Take by mouth.        . nitroGLYCERIN (NITROSTAT) 0.4 MG SL tablet Place 0.4 mg under the tongue every 5 (five) minutes as needed.        . Omega-3 Fatty Acids (FISH OIL) 1200 MG CAPS Take 1,200 mg by mouth 2 (two) times daily.        Marland Kitchen omeprazole (PRILOSEC) 20 MG capsule Take 20 mg by mouth 2 (two) times daily.       Marland Kitchen oxycodone (OXYCONTIN) 30 MG TB12 Take 30 mg by mouth 2 (two) times daily.        Marland Kitchen oxyCODONE-acetaminophen (PERCOCET) 10-325 MG per tablet Take 1 tablet by mouth every 4 (four) hours as needed.        . sennosides-docusate sodium (SENOKOT-S) 8.6-50 MG tablet Take 1 tablet by mouth daily.        . simvastatin (ZOCOR) 80 MG tablet Take 40 mg by mouth at bedtime.       . Tamsulosin HCl (FLOMAX) 0.4 MG CAPS Take 0.4 mg by mouth.        . zolpidem (AMBIEN) 10 MG tablet Take 10 mg by mouth at bedtime as needed.  No Known Allergies  Review of Systems negative except from HPI and PMH  Physical Exam BP 94/52  Pulse 75  Ht 5\' 9"  (1.753 m)  Wt 161 lb (73.029 kg)  BMI 23.78 kg/m2 Well developed and well nourished in no acute distress HENT normal E scleral and icterus clear Neck Supple JVP flat; carotids brisk and full Clear to ausculation Regular rate and rhythm, no murmurs gallops or rub Soft with active bowel sounds No clubbing cyanosis none Edema Alert and oriented, grossly normal motor and sensory function Skin Warm and Dry Looks sad   Assessment and  Plan

## 2011-03-17 NOTE — Assessment & Plan Note (Signed)
Stable-sort of. I wonder whether he be a candidate for an ACE inhibitor instead of nitroglycerin. I will defer these decisions to Dr. Laurena Bering

## 2011-03-17 NOTE — Assessment & Plan Note (Signed)
The patient's device was interrogated.  The information was reviewed. No changes were made in the programming.    

## 2011-04-02 ENCOUNTER — Inpatient Hospital Stay: Payer: Self-pay | Admitting: Internal Medicine

## 2011-04-02 LAB — COMPREHENSIVE METABOLIC PANEL
Albumin: 3.7 g/dL (ref 3.4–5.0)
Alkaline Phosphatase: 46 U/L — ABNORMAL LOW (ref 50–136)
Anion Gap: 8 (ref 7–16)
BUN: 44 mg/dL — ABNORMAL HIGH (ref 7–18)
Bilirubin,Total: 0.6 mg/dL (ref 0.2–1.0)
Creatinine: 2.11 mg/dL — ABNORMAL HIGH (ref 0.60–1.30)
Glucose: 131 mg/dL — ABNORMAL HIGH (ref 65–99)
Potassium: 4.2 mmol/L (ref 3.5–5.1)
SGOT(AST): 62 U/L — ABNORMAL HIGH (ref 15–37)
SGPT (ALT): 23 U/L
Total Protein: 7.1 g/dL (ref 6.4–8.2)

## 2011-04-02 LAB — CBC
HGB: 9.6 g/dL — ABNORMAL LOW (ref 13.0–18.0)
MCV: 93 fL (ref 80–100)
RBC: 3.11 10*6/uL — ABNORMAL LOW (ref 4.40–5.90)
WBC: 3.7 10*3/uL — ABNORMAL LOW (ref 3.8–10.6)

## 2011-04-02 LAB — APTT: Activated PTT: 35.3 secs (ref 23.6–35.9)

## 2011-04-02 LAB — PROTIME-INR
INR: 1.3
Prothrombin Time: 16.1 secs — ABNORMAL HIGH (ref 11.5–14.7)

## 2011-04-02 LAB — TROPONIN I: Troponin-I: 0.03 ng/mL

## 2011-04-02 LAB — CK TOTAL AND CKMB (NOT AT ARMC)
CK, Total: 260 U/L — ABNORMAL HIGH (ref 35–232)
CK-MB: 4.2 ng/mL — ABNORMAL HIGH (ref 0.5–3.6)

## 2011-04-03 LAB — BASIC METABOLIC PANEL
Anion Gap: 9 (ref 7–16)
BUN: 39 mg/dL — ABNORMAL HIGH (ref 7–18)
Calcium, Total: 8.8 mg/dL (ref 8.5–10.1)
Chloride: 105 mmol/L (ref 98–107)
Co2: 28 mmol/L (ref 21–32)
Creatinine: 1.83 mg/dL — ABNORMAL HIGH (ref 0.60–1.30)
Osmolality: 293 (ref 275–301)
Potassium: 3.9 mmol/L (ref 3.5–5.1)

## 2011-05-28 ENCOUNTER — Ambulatory Visit: Payer: Self-pay | Admitting: Vascular Surgery

## 2011-05-28 LAB — CREATININE, SERUM: Creatinine: 1.94 mg/dL — ABNORMAL HIGH (ref 0.60–1.30)

## 2011-06-03 ENCOUNTER — Ambulatory Visit: Payer: Self-pay | Admitting: Vascular Surgery

## 2011-06-07 ENCOUNTER — Emergency Department: Payer: Self-pay | Admitting: Emergency Medicine

## 2011-06-07 LAB — COMPREHENSIVE METABOLIC PANEL
Albumin: 3.6 g/dL (ref 3.4–5.0)
BUN: 31 mg/dL — ABNORMAL HIGH (ref 7–18)
Bilirubin,Total: 0.8 mg/dL (ref 0.2–1.0)
Calcium, Total: 8.5 mg/dL (ref 8.5–10.1)
Co2: 25 mmol/L (ref 21–32)
EGFR (African American): 47 — ABNORMAL LOW
Osmolality: 284 (ref 275–301)
SGOT(AST): 87 U/L — ABNORMAL HIGH (ref 15–37)
Total Protein: 6.9 g/dL (ref 6.4–8.2)

## 2011-06-07 LAB — CBC
HCT: 27.3 % — ABNORMAL LOW (ref 40.0–52.0)
HGB: 9.1 g/dL — ABNORMAL LOW (ref 13.0–18.0)
Platelet: 122 10*3/uL — ABNORMAL LOW (ref 150–440)

## 2011-06-07 LAB — TROPONIN I: Troponin-I: 0.03 ng/mL

## 2011-06-07 LAB — LIPASE, BLOOD: Lipase: 226 U/L (ref 73–393)

## 2011-06-07 LAB — CK TOTAL AND CKMB (NOT AT ARMC): CK-MB: 5.9 ng/mL — ABNORMAL HIGH (ref 0.5–3.6)

## 2011-06-07 LAB — PRO B NATRIURETIC PEPTIDE: B-Type Natriuretic Peptide: 5275 pg/mL — ABNORMAL HIGH (ref 0–450)

## 2011-09-10 ENCOUNTER — Encounter: Payer: Self-pay | Admitting: *Deleted

## 2011-09-14 ENCOUNTER — Inpatient Hospital Stay: Payer: Self-pay | Admitting: Internal Medicine

## 2011-09-14 LAB — COMPREHENSIVE METABOLIC PANEL
Alkaline Phosphatase: 149 U/L — ABNORMAL HIGH (ref 50–136)
Anion Gap: 9 (ref 7–16)
Calcium, Total: 8.2 mg/dL — ABNORMAL LOW (ref 8.5–10.1)
Chloride: 106 mmol/L (ref 98–107)
Co2: 26 mmol/L (ref 21–32)
Creatinine: 1.46 mg/dL — ABNORMAL HIGH (ref 0.60–1.30)
EGFR (African American): 53 — ABNORMAL LOW
EGFR (Non-African Amer.): 46 — ABNORMAL LOW
Potassium: 3.9 mmol/L (ref 3.5–5.1)
SGOT(AST): 43 U/L — ABNORMAL HIGH (ref 15–37)
SGPT (ALT): 22 U/L

## 2011-09-14 LAB — TROPONIN I: Troponin-I: 0.04 ng/mL

## 2011-09-14 LAB — URINALYSIS, COMPLETE
Bacteria: NONE SEEN
Ketone: NEGATIVE
Leukocyte Esterase: NEGATIVE
RBC,UR: <1 /HPF (ref 0–5)
Specific Gravity: 1.01 (ref 1.003–1.030)
Squamous Epithelial: NONE SEEN
WBC UR: <1 /HPF (ref 0–5)

## 2011-09-14 LAB — RETICULOCYTES: Absolute Retic Count: 0.0408 10*6/uL (ref 0.024–0.084)

## 2011-09-14 LAB — PROTIME-INR
INR: 1.2
Prothrombin Time: 15.5 secs — ABNORMAL HIGH (ref 11.5–14.7)

## 2011-09-14 LAB — CBC
Platelet: 121 10*3/uL — ABNORMAL LOW (ref 150–440)
RDW: 16.1 % — ABNORMAL HIGH (ref 11.5–14.5)
WBC: 3.9 10*3/uL (ref 3.8–10.6)

## 2011-09-14 LAB — FOLATE: Folic Acid: 53.9 ng/mL (ref 3.1–100.0)

## 2011-09-14 LAB — APTT: Activated PTT: 34.5 secs (ref 23.6–35.9)

## 2011-09-14 LAB — LACTATE DEHYDROGENASE: LDH: 313 U/L — ABNORMAL HIGH (ref 87–241)

## 2011-09-15 ENCOUNTER — Encounter: Payer: Medicare Other | Admitting: Internal Medicine

## 2011-09-15 LAB — TROPONIN I: Troponin-I: 0.05 ng/mL

## 2011-09-15 LAB — CBC WITH DIFFERENTIAL/PLATELET
Basophil #: 0.1 10*3/uL (ref 0.0–0.1)
Eosinophil %: 3.5 %
Lymphocyte %: 28.4 %
MCHC: 32 g/dL (ref 32.0–36.0)
Monocyte %: 10.7 %
Neutrophil %: 56 %
Platelet: 118 10*3/uL — ABNORMAL LOW (ref 150–440)
RBC: 2.72 10*6/uL — ABNORMAL LOW (ref 4.40–5.90)
RDW: 16.1 % — ABNORMAL HIGH (ref 11.5–14.5)
WBC: 3.9 10*3/uL (ref 3.8–10.6)

## 2011-09-15 LAB — LIPID PANEL
Ldl Cholesterol, Calc: 71 mg/dL (ref 0–100)
Triglycerides: 38 mg/dL (ref 0–200)
VLDL Cholesterol, Calc: 8 mg/dL (ref 5–40)

## 2011-09-15 LAB — BASIC METABOLIC PANEL
Anion Gap: 7 (ref 7–16)
BUN: 36 mg/dL — ABNORMAL HIGH (ref 7–18)
Calcium, Total: 8.2 mg/dL — ABNORMAL LOW (ref 8.5–10.1)
Chloride: 105 mmol/L (ref 98–107)
Co2: 29 mmol/L (ref 21–32)
Creatinine: 1.54 mg/dL — ABNORMAL HIGH (ref 0.60–1.30)
EGFR (Non-African Amer.): 43 — ABNORMAL LOW
Osmolality: 289 (ref 275–301)
Potassium: 3.9 mmol/L (ref 3.5–5.1)

## 2011-09-15 LAB — HEMOGLOBIN: HGB: 7 g/dL — ABNORMAL LOW (ref 13.0–18.0)

## 2011-09-16 LAB — CBC WITH DIFFERENTIAL/PLATELET
Basophil %: 1.8 %
Eosinophil %: 4.8 %
HCT: 22.9 % — ABNORMAL LOW (ref 40.0–52.0)
HGB: 7 g/dL — ABNORMAL LOW (ref 13.0–18.0)
Lymphocyte #: 1 10*3/uL (ref 1.0–3.6)
MCH: 25.3 pg — ABNORMAL LOW (ref 26.0–34.0)
MCV: 83 fL (ref 80–100)
Monocyte #: 0.4 x10 3/mm (ref 0.2–1.0)
Monocyte %: 10.5 %
Platelet: 126 10*3/uL — ABNORMAL LOW (ref 150–440)
RBC: 2.77 10*6/uL — ABNORMAL LOW (ref 4.40–5.90)

## 2011-09-16 LAB — BASIC METABOLIC PANEL
BUN: 30 mg/dL — ABNORMAL HIGH (ref 7–18)
Calcium, Total: 8.3 mg/dL — ABNORMAL LOW (ref 8.5–10.1)
Co2: 26 mmol/L (ref 21–32)
EGFR (African American): >60
EGFR (Non-African Amer.): 53 — ABNORMAL LOW
Osmolality: 286 (ref 275–301)
Sodium: 141 mmol/L (ref 136–145)

## 2011-09-17 ENCOUNTER — Telehealth: Payer: Self-pay

## 2011-09-17 NOTE — Telephone Encounter (Signed)
Pt's wife called to say pt missed appt withy Dr. Graciela Husbands and device check 7/16 d/t pt being in hosp.  She is calling to r/s. I explained Dr. Graciela Husbands not back in office until 8/29. She would like pt to be seen sooner than this. I had her hold while I called [place] office.   Dr. Graciela Husbands had no openings until October in [place], the PA, can see pt 09/24/11 at 2:30.  Pts wife was informed and understanding verb.

## 2011-09-24 ENCOUNTER — Encounter: Payer: Medicare Other | Admitting: Cardiology

## 2011-09-24 ENCOUNTER — Encounter: Payer: Self-pay | Admitting: Nurse Practitioner

## 2011-09-24 ENCOUNTER — Ambulatory Visit (INDEPENDENT_AMBULATORY_CARE_PROVIDER_SITE_OTHER): Payer: Medicare Other | Admitting: Nurse Practitioner

## 2011-09-24 ENCOUNTER — Encounter: Payer: Self-pay | Admitting: Internal Medicine

## 2011-09-24 VITALS — BP 124/64 | HR 75 | Ht 69.0 in | Wt 159.1 lb

## 2011-09-24 DIAGNOSIS — Z95810 Presence of automatic (implantable) cardiac defibrillator: Secondary | ICD-10-CM

## 2011-09-24 DIAGNOSIS — I255 Ischemic cardiomyopathy: Secondary | ICD-10-CM

## 2011-09-24 DIAGNOSIS — I5022 Chronic systolic (congestive) heart failure: Secondary | ICD-10-CM

## 2011-09-24 DIAGNOSIS — I2510 Atherosclerotic heart disease of native coronary artery without angina pectoris: Secondary | ICD-10-CM

## 2011-09-24 DIAGNOSIS — I2589 Other forms of chronic ischemic heart disease: Secondary | ICD-10-CM

## 2011-09-24 NOTE — Progress Notes (Signed)
Patient Name: Randall David Date of Encounter: 09/24/2011  Primary Care Provider:  Barbette Reichmann, MD Primary Cardiologist:  Odessa Fleming, MD (EP only)/Dr. Gwen Pounds in [place]  Patient Profile  78 y/o male with h/o CAD, ICM who presents for f/u.  Problem List   Past Medical History  Diagnosis Date  . Ischemic cardiomyopathy     a. 2010 s/p MDT Concerto II CRT-D, [id number]  . Chronic systolic heart failure     a. 05/6960 Echo: EF 20-25%, Diff HK, Post AK, Triv AI, Mild MR, Mod Tr  . Automatic implantable cardiac defibrillator in situ     a. 2010 s/p MDT Concerto II CRT-D, [id number]  . Hyperlipidemia   . Hypertension   . Unspecified disorder resulting from impaired renal function   . Osteoarthrosis, unspecified whether generalized or localized, unspecified site   . Esophageal reflux   . Obstructive sleep apnea (adult) (pediatric)   . Tricuspid regurgitation     a. Mod by echo 04/2009  . CAD (coronary artery disease)     a. s/p CABG and Redo CABG in [place] with additional Redo @ [facility] approx 2008 (Followed by Dr. Gwen Pounds in [place])   * Anemia  A. S/p EGD 08/2011 - no source of bleeding.  Past Surgical History  Procedure Date  . Insert / replace / remove pacemaker     CRT-D  . Coronary artery bypass graft   . Total hip arthroplasty     Allergies  No Known Allergies  HPI  78 y/o male with the above problem list.  Last week, he was admitted to [facility] 2/2 wkns and anemia with a Hgb of 6.4 (per son).  During that admission, he was apparently transfused and underwent GI eval with EGD, which did not source of bleeding/anemia.  Pt is scheduled to f/u with GI in [place] next week.  While hospitalized, he was apparently told that he "had a few events" on telemetry and he was advised to f/u with his electrophysiologist for a device check.  This was performed today and showed no significant events.    From a cardiac standpoint, pt reports being stable.  He  denies DOE, pnd, orthopnea, n, v, dizziness, or syncope, but does occasionally have chest pain requiring NTG.  He thinks his last episode of chest pain was a few months ago and he has f/u with his primary cardiologist next week.  Home Medications  Prior to Admission medications   Medication Sig Start Date End Date Taking? Authorizing Provider  aspirin 81 MG EC tablet Take 81 mg by mouth daily.     Yes Historical Provider, MD  calcium carbonate (TUMS - DOSED IN MG ELEMENTAL CALCIUM) 500 MG chewable tablet Chew 1 tablet by mouth daily.     Yes Historical Provider, MD  citalopram (CELEXA) 20 MG tablet Take 40 mg by mouth daily.    Yes Historical Provider, MD  Ferrous Sulfate (IRON) 325 (65 FE) MG TABS Take by mouth.   Yes Historical Provider, MD  furosemide (LASIX) 40 MG tablet Take 40 mg by mouth daily.    Yes Historical Provider, MD  levothyroxine (SYNTHROID, LEVOTHROID) 25 MCG tablet Take 25 mcg by mouth daily.   Yes Historical Provider, MD  metoprolol (LOPRESSOR) 50 MG tablet Take 25 mg by mouth 2 (two) times daily.    Yes Historical Provider, MD  Misc Natural Products (GLUCOSAMINE CHONDROITIN ADV) TABS Take by mouth.     Yes Historical Provider, MD  morphine (MSIR) 30 MG  tablet Take 30 mg by mouth every 12 (twelve) hours.   Yes Historical Provider, MD  Multiple Vitamins-Minerals (CENTRUM SILVER PO) Take by mouth.     Yes Historical Provider, MD  nitroGLYCERIN (NITROSTAT) 0.4 MG SL tablet Place 0.4 mg under the tongue every 5 (five) minutes as needed.     Yes Historical Provider, MD  Omega-3 Fatty Acids (FISH OIL) 1200 MG CAPS Take 1,200 mg by mouth 2 (two) times daily.     Yes Historical Provider, MD  omeprazole (PRILOSEC) 20 MG capsule Take 20 mg by mouth 2 (two) times daily.    Yes Historical Provider, MD  oxyCODONE-acetaminophen (PERCOCET) 10-325 MG per tablet Take 1 tablet by mouth every 4 (four) hours as needed.     Yes Historical Provider, MD  senna (SENOKOT) 8.6 MG tablet Take 1 tablet  by mouth daily.   Yes Historical Provider, MD  sennosides-docusate sodium (SENOKOT-S) 8.6-50 MG tablet Take 1 tablet by mouth daily.     Yes Historical Provider, MD    Review of Systems  Occasional c/p as outlined above.  All other systems reviewed and are otherwise negative except as noted above.  Physical Exam  Blood pressure 124/64, pulse 75, height 5\' 9"  (1.753 m), weight 159 lb 1.9 oz (72.176 kg).  General: Pleasant, NAD Psych: Normal affect. Neuro: Alert and oriented X 3. Moves all extremities spontaneously. HEENT: Normal  Neck: Supple without bruits or JVD. Lungs:  Resp regular and unlabored, CTA. Heart: RRR no s3, s4.  2/6 SM heard throughout. Abdomen: Soft, non-tender, non-distended, BS + x 4.  Extremities: No clubbing, cyanosis.  1+ bilat LEE. DP/PT/Radials 1+ and equal bilaterally.  Accessory Clinical Findings  ECG - AV paced, 75.  Assessment & Plan  1.  ICM/Chronic Syst CHF:  Device interrogated today and was without any significant events with nl device fxn.  Pt says weight/volume status has been stable and that LEE is chronic.  He has f/u with his primary cardiologist next week.  Cont current dose of lasix and bb (consider switching to long-acting - defer to primary cardiologist).  He is not on a ACEI/ARB presumably 2/2 h/o CKD - defer to primary cardiologist.  2.  CAD:  He occasionally has c/p.  He has f/u with Dr. Gwen Pounds next week.  He has not had an ischemic eval since his last CABG.  He remains on asa, bb.  ? Not on statin.  3.  HTN:  Stable.  4.  Dispo:  F/u device clinic in [place] as scheduled.  Nicolasa Ducking, NP 09/24/2011, 5:07 PM

## 2011-09-25 LAB — ICD DEVICE OBSERVATION
AL AMPLITUDE: 0.3 mv
AL THRESHOLD: 0.75 V
ATRIAL PACING ICD: 93 pct
BAMS-0001: 150 {beats}/min
BATTERY VOLTAGE: 2.91 V
LV LEAD IMPEDENCE ICD: 266 Ohm
RV LEAD AMPLITUDE: 1.9 mv
TZAT-0001ATACH: 3
TZAT-0001FASTVT: 1
TZAT-0001SLOWVT: 1
TZAT-0001SLOWVT: 2
TZAT-0002FASTVT: NEGATIVE
TZAT-0012ATACH: 150 ms
TZAT-0012ATACH: 150 ms
TZAT-0012ATACH: 150 ms
TZAT-0012FASTVT: 200 ms
TZAT-0012SLOWVT: 200 ms
TZAT-0012SLOWVT: 200 ms
TZAT-0013SLOWVT: 2
TZAT-0013SLOWVT: 2
TZAT-0018ATACH: NEGATIVE
TZAT-0018FASTVT: NEGATIVE
TZAT-0019ATACH: 6 V
TZAT-0019SLOWVT: 8 V
TZAT-0019SLOWVT: 8 V
TZAT-0020ATACH: 1.5 ms
TZAT-0020ATACH: 1.5 ms
TZAT-0020SLOWVT: 1.5 ms
TZAT-0020SLOWVT: 1.5 ms
TZON-0003ATACH: 400 ms
TZON-0003SLOWVT: 350 ms
TZON-0003VSLOWVT: 390 ms
TZST-0001ATACH: 4
TZST-0001ATACH: 6
TZST-0001FASTVT: 2
TZST-0001FASTVT: 4
TZST-0001FASTVT: 6
TZST-0001SLOWVT: 3
TZST-0001SLOWVT: 5
TZST-0002ATACH: NEGATIVE
TZST-0002FASTVT: NEGATIVE
TZST-0002FASTVT: NEGATIVE
TZST-0002FASTVT: NEGATIVE
TZST-0003SLOWVT: 25 J
TZST-0003SLOWVT: 35 J
VENTRICULAR PACING ICD: 97.4 pct

## 2011-10-08 ENCOUNTER — Ambulatory Visit: Payer: Self-pay | Admitting: Internal Medicine

## 2011-10-08 LAB — CBC CANCER CENTER
HCT: 24.8 % — ABNORMAL LOW (ref 40.0–52.0)
HGB: 7.9 g/dL — ABNORMAL LOW (ref 13.0–18.0)
Lymphocytes: 14 %
MCH: 26.9 pg (ref 26.0–34.0)
MCHC: 31.7 g/dL — ABNORMAL LOW (ref 32.0–36.0)
MCV: 85 fL (ref 80–100)
RDW: 21.2 % — ABNORMAL HIGH (ref 11.5–14.5)
WBC: 4.1 x10 3/mm (ref 3.8–10.6)

## 2011-10-08 LAB — RETICULOCYTES: Reticulocyte: 1.4 % (ref 0.7–2.5)

## 2011-10-08 LAB — IRON AND TIBC
Iron Saturation: 33 %
Iron: 147 ug/dL (ref 65–175)

## 2011-10-08 LAB — PROTIME-INR
INR: 1.1
Prothrombin Time: 14.9 secs — ABNORMAL HIGH (ref 11.5–14.7)

## 2011-10-08 LAB — FERRITIN: Ferritin (ARMC): 36 ng/mL (ref 8–388)

## 2011-10-08 LAB — LACTATE DEHYDROGENASE: LDH: 272 U/L — ABNORMAL HIGH (ref 81–234)

## 2011-10-09 LAB — PROT IMMUNOELECTROPHORES(ARMC)

## 2011-10-09 LAB — URINE IEP, RANDOM

## 2011-10-13 LAB — CBC CANCER CENTER
Eosinophil: 3 %
HCT: 23.1 % — ABNORMAL LOW (ref 40.0–52.0)
HGB: 7.1 g/dL — ABNORMAL LOW (ref 13.0–18.0)
MCHC: 30.8 g/dL — ABNORMAL LOW (ref 32.0–36.0)
MCV: 86 fL (ref 80–100)
Monocytes: 3 %
Platelet: 111 x10 3/mm — ABNORMAL LOW (ref 150–440)
RBC: 2.7 10*6/uL — ABNORMAL LOW (ref 4.40–5.90)
WBC: 4.5 x10 3/mm (ref 3.8–10.6)

## 2011-10-21 LAB — CBC CANCER CENTER
Basophil %: 0.9 %
Eosinophil #: 0.1 x10 3/mm (ref 0.0–0.7)
Eosinophil %: 3.7 %
HCT: 25.6 % — ABNORMAL LOW (ref 40.0–52.0)
HGB: 7.9 g/dL — ABNORMAL LOW (ref 13.0–18.0)
Lymphocyte %: 20 %
MCHC: 30.7 g/dL — ABNORMAL LOW (ref 32.0–36.0)
Monocyte #: 0.3 x10 3/mm (ref 0.2–1.0)
Monocyte %: 8.5 %
Neutrophil #: 2 x10 3/mm (ref 1.4–6.5)
Neutrophil %: 66.9 %
Platelet: 117 x10 3/mm — ABNORMAL LOW (ref 150–440)
RBC: 2.92 10*6/uL — ABNORMAL LOW (ref 4.40–5.90)
RDW: 23.1 % — ABNORMAL HIGH (ref 11.5–14.5)
WBC: 3.1 x10 3/mm — ABNORMAL LOW (ref 3.8–10.6)

## 2011-11-01 ENCOUNTER — Ambulatory Visit: Payer: Self-pay | Admitting: Internal Medicine

## 2011-12-16 ENCOUNTER — Ambulatory Visit: Payer: Self-pay | Admitting: Internal Medicine

## 2011-12-16 LAB — CBC CANCER CENTER
Basophil #: 0.1 x10 3/mm (ref 0.0–0.1)
Basophil %: 1.8 %
Eosinophil %: 2.3 %
Lymphocyte #: 0.8 x10 3/mm — ABNORMAL LOW (ref 1.0–3.6)
Lymphocyte %: 21.4 %
Monocyte %: 9.1 %
Neutrophil %: 65.4 %
Platelet: 119 x10 3/mm — ABNORMAL LOW (ref 150–440)
RDW: 17 % — ABNORMAL HIGH (ref 11.5–14.5)
WBC: 3.7 x10 3/mm — ABNORMAL LOW (ref 3.8–10.6)

## 2011-12-16 LAB — IRON AND TIBC
Iron: 104 ug/dL (ref 65–175)
Unbound Iron-Bind.Cap.: 322 ug/dL

## 2011-12-16 LAB — FERRITIN: Ferritin (ARMC): 31 ng/mL (ref 8–388)

## 2012-01-01 ENCOUNTER — Ambulatory Visit: Payer: Self-pay | Admitting: Internal Medicine

## 2012-01-05 ENCOUNTER — Ambulatory Visit (INDEPENDENT_AMBULATORY_CARE_PROVIDER_SITE_OTHER): Payer: Medicare Other | Admitting: *Deleted

## 2012-01-05 DIAGNOSIS — I5022 Chronic systolic (congestive) heart failure: Secondary | ICD-10-CM

## 2012-01-05 DIAGNOSIS — I255 Ischemic cardiomyopathy: Secondary | ICD-10-CM

## 2012-01-05 DIAGNOSIS — I2589 Other forms of chronic ischemic heart disease: Secondary | ICD-10-CM

## 2012-01-05 LAB — ICD DEVICE OBSERVATION
AL IMPEDENCE ICD: 418 Ohm
AL THRESHOLD: 0.5 V
ATRIAL PACING ICD: 82.49 pct
BAMS-0001: 150 {beats}/min
CHARGE TIME: 11.13 s
PACEART VT: 0
RV LEAD AMPLITUDE: 1.875 mv
RV LEAD IMPEDENCE ICD: 418 Ohm
TOT-0001: 1
TOT-0002: 0
TZAT-0001ATACH: 1
TZAT-0001ATACH: 3
TZAT-0001FASTVT: 1
TZAT-0001SLOWVT: 1
TZAT-0001SLOWVT: 2
TZAT-0002FASTVT: NEGATIVE
TZAT-0004SLOWVT: 8
TZAT-0012ATACH: 150 ms
TZAT-0012ATACH: 150 ms
TZAT-0012SLOWVT: 200 ms
TZAT-0012SLOWVT: 200 ms
TZAT-0013SLOWVT: 2
TZAT-0013SLOWVT: 2
TZAT-0018ATACH: NEGATIVE
TZAT-0018ATACH: NEGATIVE
TZAT-0018FASTVT: NEGATIVE
TZAT-0018SLOWVT: NEGATIVE
TZAT-0018SLOWVT: NEGATIVE
TZAT-0019SLOWVT: 8 V
TZAT-0019SLOWVT: 8 V
TZAT-0020ATACH: 1.5 ms
TZAT-0020SLOWVT: 1.5 ms
TZAT-0020SLOWVT: 1.5 ms
TZON-0003ATACH: 400 ms
TZON-0003SLOWVT: 350 ms
TZON-0003VSLOWVT: 390 ms
TZON-0004SLOWVT: 20
TZON-0005SLOWVT: 12
TZST-0001ATACH: 4
TZST-0001ATACH: 5
TZST-0001ATACH: 6
TZST-0001FASTVT: 2
TZST-0001FASTVT: 4
TZST-0001FASTVT: 6
TZST-0001SLOWVT: 3
TZST-0001SLOWVT: 5
TZST-0001SLOWVT: 6
TZST-0002ATACH: NEGATIVE
TZST-0002ATACH: NEGATIVE
TZST-0002FASTVT: NEGATIVE
TZST-0002FASTVT: NEGATIVE
TZST-0002FASTVT: NEGATIVE
TZST-0003SLOWVT: 25 J
TZST-0003SLOWVT: 35 J

## 2012-01-05 NOTE — Progress Notes (Signed)
ICD check with ICM 

## 2012-01-22 ENCOUNTER — Emergency Department: Payer: Self-pay | Admitting: Emergency Medicine

## 2012-01-26 DIAGNOSIS — S42309A Unspecified fracture of shaft of humerus, unspecified arm, initial encounter for closed fracture: Secondary | ICD-10-CM

## 2012-01-26 HISTORY — DX: Unspecified fracture of shaft of humerus, unspecified arm, initial encounter for closed fracture: S42.309A

## 2012-01-27 ENCOUNTER — Emergency Department: Payer: Self-pay

## 2012-02-03 ENCOUNTER — Emergency Department: Payer: Self-pay | Admitting: Emergency Medicine

## 2012-02-09 ENCOUNTER — Encounter: Payer: Self-pay | Admitting: Internal Medicine

## 2012-02-09 ENCOUNTER — Ambulatory Visit (INDEPENDENT_AMBULATORY_CARE_PROVIDER_SITE_OTHER): Payer: Medicare Other | Admitting: Internal Medicine

## 2012-02-09 VITALS — BP 110/60 | HR 73 | Ht 69.0 in | Wt 159.0 lb

## 2012-02-09 DIAGNOSIS — Z9181 History of falling: Secondary | ICD-10-CM

## 2012-02-09 DIAGNOSIS — I10 Essential (primary) hypertension: Secondary | ICD-10-CM

## 2012-02-09 DIAGNOSIS — I2589 Other forms of chronic ischemic heart disease: Secondary | ICD-10-CM

## 2012-02-09 DIAGNOSIS — I5022 Chronic systolic (congestive) heart failure: Secondary | ICD-10-CM

## 2012-02-09 DIAGNOSIS — I951 Orthostatic hypotension: Secondary | ICD-10-CM | POA: Insufficient documentation

## 2012-02-09 DIAGNOSIS — W19XXXA Unspecified fall, initial encounter: Secondary | ICD-10-CM

## 2012-02-09 DIAGNOSIS — Z95810 Presence of automatic (implantable) cardiac defibrillator: Secondary | ICD-10-CM

## 2012-02-09 DIAGNOSIS — I255 Ischemic cardiomyopathy: Secondary | ICD-10-CM

## 2012-02-09 NOTE — Assessment & Plan Note (Signed)
Stable continue current medications

## 2012-02-09 NOTE — Progress Notes (Signed)
Patient Care Team: Barbette Reichmann, MD as PCP - General (Internal Medicine)   HPI  Randall David is a 78 y.o. male Seen in followup ischemic cardiomyopathy depressed left ventricular function congestive heart heart failure and previously implanted CRT-D.  Continues to suffer with significant fatigue  His breathing is stable there is no peripheral edema and no significant chest pain.  Ultrasound 2010 or so demonstrated ejection fraction of 20-25% there is moderate regurgitation the tricuspid valve with right atrial enlargement with right ventricular systolic dysfunction   He looks terrible today. He has suffered a couple of falls while standing or walking. He broke his arm on one occasion and lacerated his scalp one another both in the week before Thanksgiving. The family has now put him in a wheelchair      Past Medical History  Diagnosis Date  . Ischemic cardiomyopathy     a. 2010 s/p MDT Concerto II CRT-D, [id number]  . Chronic systolic heart failure     a. 03/1912 Echo: EF 20-25%, Diff HK, Post AK, Triv AI, Mild MR, Mod Tr  . Automatic implantable cardiac defibrillator in situ     a. 2010 s/p MDT Concerto II CRT-D, [id number]  . Hyperlipidemia   . Hypertension   . Unspecified disorder resulting from impaired renal function   . Osteoarthrosis, unspecified whether generalized or localized, unspecified site   . Esophageal reflux   . Obstructive sleep apnea (adult) (pediatric)   . Tricuspid regurgitation     a. Mod by echo 04/2009  . CAD (coronary artery disease)     a. s/p CABG and Redo CABG in [place] with additional Redo @ [facility] approx 2008 (Followed by Dr. Gwen Pounds in [place])  . Anemia     a. s/p transfusion and EGD 08/2011 w/o finding of bleeding.  . Broken humerus 01/26/12    Past Surgical History  Procedure Date  . Insert / replace / remove pacemaker     CRT-D  . Coronary artery bypass graft   . Total hip arthroplasty     Current Outpatient Prescriptions   Medication Sig Dispense Refill  . aspirin 81 MG EC tablet Take 81 mg by mouth daily.        . calcium carbonate (TUMS - DOSED IN MG ELEMENTAL CALCIUM) 500 MG chewable tablet Chew 1 tablet by mouth daily.        . citalopram (CELEXA) 20 MG tablet Take 40 mg by mouth daily.       . DULoxetine (CYMBALTA) 20 MG capsule Take 20 mg by mouth daily.      . Ferrous Sulfate (IRON) 325 (65 FE) MG TABS Take by mouth.      . furosemide (LASIX) 40 MG tablet Take 40 mg by mouth daily. 1 in the am and 1/2 in the pm if needed      . levothyroxine (SYNTHROID, LEVOTHROID) 25 MCG tablet Take 25 mcg by mouth daily.      . metoprolol (LOPRESSOR) 50 MG tablet Take 25 mg by mouth 2 (two) times daily.       . Misc Natural Products (GLUCOSAMINE CHONDROITIN ADV) TABS Take by mouth.        . morphine (MSIR) 30 MG tablet Take 30 mg by mouth every 12 (twelve) hours.      . Multiple Vitamins-Minerals (CENTRUM SILVER PO) Take by mouth.        . nitroGLYCERIN (NITROSTAT) 0.4 MG SL tablet Place 0.4 mg under the tongue every 5 (five) minutes as needed.        Marland Kitchen  Omega-3 Fatty Acids (FISH OIL) 1200 MG CAPS Take 1,200 mg by mouth 2 (two) times daily.        Marland Kitchen omeprazole (PRILOSEC) 20 MG capsule Take 20 mg by mouth 2 (two) times daily.       Marland Kitchen oxyCODONE-acetaminophen (PERCOCET) 10-325 MG per tablet Take 1 tablet by mouth every 4 (four) hours as needed.        . senna (SENOKOT) 8.6 MG tablet Take 1 tablet by mouth daily.      . sennosides-docusate sodium (SENOKOT-S) 8.6-50 MG tablet Take 1 tablet by mouth daily.        Marland Kitchen spironolactone (ALDACTONE) 25 MG tablet Take 25 mg by mouth daily.        No Known Allergies  Review of Systems negative except from HPI and PMH  Physical Exam BP 110/60  Pulse 73  Ht 5\' 9"  (1.753 m)  Wt 159 lb (72.122 kg)  BMI 23.48 kg/m2 Well developed and nourished in no acute distress HENT normal Neck supple with JVP-flat Clear Regular rate and rhythm, no murmurs or gallops Abd-soft with active  BS No Clubbing cyanosis there is discoloration and significant swelling of his right hand Skin-warm and dry A & Oriented  Grossly normal sensory and motor function    Assessment and  Plan

## 2012-02-09 NOTE — Patient Instructions (Addendum)
Your physician wants you to follow-up in: January 2014 with Dr. Graciela Husbands. You will receive a reminder letter in the mail two months in advance. If you don't receive a letter, please call our office to schedule the follow-up appointment.

## 2012-02-09 NOTE — Assessment & Plan Note (Signed)
He has had significant falls resulting in broken bones and lacerated scalp. I wonder if these are related to orthostatic hypotension. He was disinclined today to submit for orthostatic testing as he feels crummy. We'll do it again next month

## 2012-02-09 NOTE — Assessment & Plan Note (Signed)
The patient's device was interrogated.  The information was reviewed. No changes were made in the programming.    

## 2012-02-09 NOTE — Assessment & Plan Note (Signed)
He is blood pressure medications have been gradually reduced. His blood pressure today is only about 105. This corroborates the hypothesis of orthostatic falls

## 2012-02-10 ENCOUNTER — Ambulatory Visit: Payer: Self-pay | Admitting: Internal Medicine

## 2012-02-10 LAB — CBC CANCER CENTER
Basophil %: 1.3 %
Eosinophil #: 0.1 x10 3/mm (ref 0.0–0.7)
HCT: 24.4 % — ABNORMAL LOW (ref 40.0–52.0)
HGB: 7.8 g/dL — ABNORMAL LOW (ref 13.0–18.0)
MCH: 28.9 pg (ref 26.0–34.0)
MCHC: 31.8 g/dL — ABNORMAL LOW (ref 32.0–36.0)
Monocyte #: 0.5 x10 3/mm (ref 0.2–1.0)
Monocyte %: 11.2 %
Neutrophil #: 3.4 x10 3/mm (ref 1.4–6.5)

## 2012-03-02 ENCOUNTER — Ambulatory Visit: Payer: Self-pay | Admitting: Internal Medicine

## 2012-03-21 ENCOUNTER — Ambulatory Visit (INDEPENDENT_AMBULATORY_CARE_PROVIDER_SITE_OTHER): Payer: Medicare Other | Admitting: Internal Medicine

## 2012-03-21 ENCOUNTER — Encounter: Payer: Self-pay | Admitting: Internal Medicine

## 2012-03-21 VITALS — BP 108/58 | Ht 69.0 in | Wt 157.2 lb

## 2012-03-21 DIAGNOSIS — I951 Orthostatic hypotension: Secondary | ICD-10-CM

## 2012-03-21 DIAGNOSIS — I255 Ischemic cardiomyopathy: Secondary | ICD-10-CM

## 2012-03-21 DIAGNOSIS — G4733 Obstructive sleep apnea (adult) (pediatric): Secondary | ICD-10-CM

## 2012-03-21 DIAGNOSIS — I2589 Other forms of chronic ischemic heart disease: Secondary | ICD-10-CM

## 2012-03-21 DIAGNOSIS — I5022 Chronic systolic (congestive) heart failure: Secondary | ICD-10-CM

## 2012-03-21 DIAGNOSIS — I2510 Atherosclerotic heart disease of native coronary artery without angina pectoris: Secondary | ICD-10-CM

## 2012-03-21 LAB — ICD DEVICE OBSERVATION
AL IMPEDENCE ICD: 418 Ohm
ATRIAL PACING ICD: 89.34 pct
BRDY-0004LV: 120 {beats}/min
CHARGE TIME: 11.13 s
LV LEAD IMPEDENCE ICD: 266 Ohm
TOT-0001: 1
TOT-0002: 0
TOT-0006: 20100312000000
TZAT-0001ATACH: 3
TZAT-0001FASTVT: 1
TZAT-0001SLOWVT: 1
TZAT-0002ATACH: NEGATIVE
TZAT-0012ATACH: 150 ms
TZAT-0012FASTVT: 200 ms
TZAT-0018ATACH: NEGATIVE
TZAT-0019ATACH: 6 V
TZAT-0019ATACH: 6 V
TZAT-0019SLOWVT: 8 V
TZAT-0019SLOWVT: 8 V
TZAT-0020ATACH: 1.5 ms
TZAT-0020ATACH: 1.5 ms
TZAT-0020FASTVT: 1.5 ms
TZAT-0020SLOWVT: 1.5 ms
TZAT-0020SLOWVT: 1.5 ms
TZON-0003VSLOWVT: 390 ms
TZST-0001ATACH: 5
TZST-0001FASTVT: 4
TZST-0001FASTVT: 5
TZST-0001SLOWVT: 5
TZST-0002ATACH: NEGATIVE
TZST-0002FASTVT: NEGATIVE
TZST-0002FASTVT: NEGATIVE
TZST-0003SLOWVT: 15 J
TZST-0003SLOWVT: 35 J
TZST-0003SLOWVT: 35 J
VENTRICULAR PACING ICD: 97.53 pct
VF: 0

## 2012-03-21 MED ORDER — SPIRONOLACTONE 25 MG PO TABS: 12.5000 mg | ORAL_TABLET | Freq: Every day | ORAL | Status: DC

## 2012-03-21 NOTE — Assessment & Plan Note (Signed)
Unfortunately, right now, his falls are the greatest contribution to his morbidity and will stop his beta blocker. He'll continue on Aldactone for now a low dose. He is not on ACE inhibitors.

## 2012-03-21 NOTE — Assessment & Plan Note (Signed)
He continues to fall. Description is almost neuromuscular as opposed orthostatic. Orthostatics today were limited but were not revealing. Blood pressure could be an issue not withstanding. There is no improvement we the patient's wife cut the Lopressor in half. We will stop it altogether and is cut the Aldactone in half for another experiment. He is to followup with his PCP the next few weeks. I would encourage them to consider neurological evaluation. If this is unrevealing we can undertake tilt table testing.

## 2012-03-21 NOTE — Assessment & Plan Note (Signed)
stable °

## 2012-03-21 NOTE — Progress Notes (Signed)
[facility] Patient Care Team: Barbette Reichmann, MD as PCP - General (Internal Medicine)   HPI  Randall David is a 79 y.o. male Seen in followup ischemic cardiomyopathy depressed left ventricular function congestive heart heart failure and previously implanted CRT-D.  Continues to suffer with significant fatigue  His breathing is stable there is no peripheral edema and no significant chest pain.  Ultrasound 2010 or so demonstrated ejection fraction of 20-25%; there is moderate regurgitation of the tricuspid valve with right atrial enlargement with right ventricular systolic dysfunction   We saw him in December. At that time he had a couple of falls while standing. He broken his arm and lacerated his scalp. He was walking in a wheelchair. HE had  been disinclined to undergo orthostatics He continues to fall  Physical therapy has been working with him. What the family describes is that when he is walking it appears that he cant stop, that his chest gets in front of his legs.  There is no assoc pallor     Past Medical History  Diagnosis Date  . Ischemic cardiomyopathy     a. 2010 s/p MDT Concerto II CRT-D, [id number]  . Chronic systolic heart failure     a. 11/5619 Echo: EF 20-25%, Diff HK, Post AK, Triv AI, Mild MR, Mod Tr  . Automatic implantable cardiac defibrillator in situ     a. 2010 s/p MDT Concerto II CRT-D, [id number]  . Hyperlipidemia   . Hypertension   . Unspecified disorder resulting from impaired renal function   . Osteoarthrosis, unspecified whether generalized or localized, unspecified site   . Esophageal reflux   . Obstructive sleep apnea (adult) (pediatric)   . Tricuspid regurgitation     a. Mod by echo 04/2009  . CAD (coronary artery disease)     a. s/p CABG and Redo CABG in [place] with additional Redo @ [facility] approx 2008 (Followed by Dr. Gwen Pounds in [place])  . Anemia     a. s/p transfusion and EGD 08/2011 w/o finding of bleeding.  . Broken humerus 01/26/12    Past  Surgical History  Procedure Date  . Insert / replace / remove pacemaker     CRT-D  . Coronary artery bypass graft   . Total hip arthroplasty     Current Outpatient Prescriptions  Medication Sig Dispense Refill  . aspirin 81 MG EC tablet Take 81 mg by mouth daily.        . calcium carbonate (TUMS - DOSED IN MG ELEMENTAL CALCIUM) 500 MG chewable tablet Chew 1 tablet by mouth daily.        . citalopram (CELEXA) 20 MG tablet Take 40 mg by mouth daily.       . DULoxetine (CYMBALTA) 20 MG capsule Take 20 mg by mouth daily.      . Ferrous Sulfate (IRON) 325 (65 FE) MG TABS Take by mouth.      . furosemide (LASIX) 40 MG tablet Take 40 mg by mouth daily. 1 in the am and 1/2 in the pm if needed      . levothyroxine (SYNTHROID, LEVOTHROID) 25 MCG tablet Take 25 mcg by mouth daily.      . meclizine (ANTIVERT) 25 MG tablet       . metoprolol (LOPRESSOR) 50 MG tablet Take 25 mg by mouth 2 (two) times daily.       . Misc Natural Products (GLUCOSAMINE CHONDROITIN ADV) TABS Take by mouth.        . morphine (MSIR) 30  MG tablet Take 30 mg by mouth every 12 (twelve) hours.      . Multiple Vitamins-Minerals (CENTRUM SILVER PO) Take by mouth.        . nitroGLYCERIN (NITROSTAT) 0.4 MG SL tablet Place 0.4 mg under the tongue every 5 (five) minutes as needed.        . Omega-3 Fatty Acids (FISH OIL) 1200 MG CAPS Take 1,200 mg by mouth 2 (two) times daily.        Marland Kitchen omeprazole (PRILOSEC) 20 MG capsule Take 20 mg by mouth 2 (two) times daily.       Marland Kitchen oxyCODONE-acetaminophen (PERCOCET) 10-325 MG per tablet Take 1 tablet by mouth every 4 (four) hours as needed.        . senna (SENOKOT) 8.6 MG tablet Take 1 tablet by mouth daily.      . sennosides-docusate sodium (SENOKOT-S) 8.6-50 MG tablet Take 1 tablet by mouth daily.        Marland Kitchen spironolactone (ALDACTONE) 25 MG tablet Take 25 mg by mouth daily.        No Known Allergies  Review of Systems negative except from HPI and PMH  Physical Exam BP 108/58  Ht 5\' 9"   (1.753 m)  Wt 157 lb 4 oz (71.328 kg)  BMI 23.22 kg/m2 Well developed and well nourished in no acute distressat Affect flat HENT normal  Scalp laceration healed E scleral and icterus clear Neck Supple Clear to ausculation  regular rate and rhythm, no murmurs gallops or rub Soft with active bowel sounds No clubbing cyanosis 1-2+  Edema Alert and oriented, grossly normal motor and sensory function Skin Warm and Dry; multiple ecchymosis    Assessment and  Plan

## 2012-03-21 NOTE — Patient Instructions (Addendum)
Your physician wants you to follow-up in: 3 months with Dr. Graciela Husbands. You will receive a reminder letter in the mail two months in advance. If you don't receive a letter, please call our office to schedule the follow-up appointment.  Your physician has recommended you make the following change in your medication:  -stop metoprolol -decrease aldactone to 1/2 tablet daily (12.5 mg)

## 2012-03-21 NOTE — Assessment & Plan Note (Signed)
Day time somnolence

## 2012-04-02 ENCOUNTER — Ambulatory Visit: Payer: Self-pay | Admitting: Internal Medicine

## 2012-04-06 LAB — CBC CANCER CENTER
Basophil %: 1.2 %
Eosinophil #: 0.2 x10 3/mm (ref 0.0–0.7)
Eosinophil %: 4.4 %
Lymphocyte #: 0.8 x10 3/mm — ABNORMAL LOW (ref 1.0–3.6)
Lymphocyte %: 20.2 %
MCHC: 32.9 g/dL (ref 32.0–36.0)
MCV: 92 fL (ref 80–100)
Monocyte %: 10.2 %
RBC: 2.71 10*6/uL — ABNORMAL LOW (ref 4.40–5.90)
RDW: 17.9 % — ABNORMAL HIGH (ref 11.5–14.5)
WBC: 3.8 x10 3/mm (ref 3.8–10.6)

## 2012-04-12 ENCOUNTER — Encounter: Payer: Medicare Other | Admitting: Internal Medicine

## 2012-04-20 ENCOUNTER — Ambulatory Visit: Payer: Self-pay | Admitting: Internal Medicine

## 2012-04-30 ENCOUNTER — Ambulatory Visit: Payer: Self-pay | Admitting: Internal Medicine

## 2012-06-23 ENCOUNTER — Encounter: Payer: Self-pay | Admitting: Internal Medicine

## 2012-06-23 ENCOUNTER — Ambulatory Visit (INDEPENDENT_AMBULATORY_CARE_PROVIDER_SITE_OTHER): Payer: Medicare Other | Admitting: Internal Medicine

## 2012-06-23 VITALS — BP 98/64 | HR 104 | Ht 69.0 in | Wt 175.0 lb

## 2012-06-23 DIAGNOSIS — Z95810 Presence of automatic (implantable) cardiac defibrillator: Secondary | ICD-10-CM

## 2012-06-23 DIAGNOSIS — I2589 Other forms of chronic ischemic heart disease: Secondary | ICD-10-CM

## 2012-06-23 DIAGNOSIS — I5022 Chronic systolic (congestive) heart failure: Secondary | ICD-10-CM

## 2012-06-23 DIAGNOSIS — I255 Ischemic cardiomyopathy: Secondary | ICD-10-CM

## 2012-06-23 DIAGNOSIS — R0602 Shortness of breath: Secondary | ICD-10-CM

## 2012-06-23 LAB — ICD DEVICE OBSERVATION
AL AMPLITUDE: 0.25 mv
AL IMPEDENCE ICD: 418 Ohm
ATRIAL PACING ICD: 89.07 pct
CHARGE TIME: 12.281 s
LV LEAD IMPEDENCE ICD: 266 Ohm
LV LEAD THRESHOLD: 1.75 V
RV LEAD IMPEDENCE ICD: 418 Ohm
TOT-0001: 1
TOT-0002: 0
TOT-0006: 20100312000000
TZAT-0001FASTVT: 1
TZAT-0002ATACH: NEGATIVE
TZAT-0011SLOWVT: 10 ms
TZAT-0011SLOWVT: 10 ms
TZAT-0012ATACH: 150 ms
TZAT-0012FASTVT: 200 ms
TZAT-0012SLOWVT: 200 ms
TZAT-0012SLOWVT: 200 ms
TZAT-0018SLOWVT: NEGATIVE
TZAT-0018SLOWVT: NEGATIVE
TZAT-0019ATACH: 6 V
TZAT-0019ATACH: 6 V
TZAT-0019SLOWVT: 8 V
TZAT-0019SLOWVT: 8 V
TZAT-0020ATACH: 1.5 ms
TZAT-0020ATACH: 1.5 ms
TZAT-0020FASTVT: 1.5 ms
TZAT-0020SLOWVT: 1.5 ms
TZAT-0020SLOWVT: 1.5 ms
TZON-0003SLOWVT: 350 ms
TZON-0005SLOWVT: 12
TZST-0001ATACH: 4
TZST-0001ATACH: 5
TZST-0001FASTVT: 3
TZST-0001FASTVT: 4
TZST-0001FASTVT: 5
TZST-0001SLOWVT: 4
TZST-0002ATACH: NEGATIVE
TZST-0002FASTVT: NEGATIVE
TZST-0002FASTVT: NEGATIVE
TZST-0003SLOWVT: 25 J
TZST-0003SLOWVT: 35 J
VENTRICULAR PACING ICD: 89.6 pct

## 2012-06-23 MED ORDER — METOLAZONE 2.5 MG PO TABS: ORAL_TABLET | ORAL | Status: AC

## 2012-06-23 NOTE — Progress Notes (Signed)
Patient Care Team: Barbette Reichmann, MD as PCP - General (Internal Medicine)   HPI  Randall David is a 79 y.o. male Seen in followup ischemic cardiomyopathy depressed left ventricular function congestive heart heart failure and previously implanted CRT-D.  Continues to suffer with significant fatigue  His breathing is stable there is no peripheral edema and no significant chest pain.  Ultrasound 2010 or so demonstrated ejection fraction of 20-25%; there is moderate regurgitation of the tricuspid valve with right atrial enlargement with right ventricular systolic dysfunction   He had had recurrent falls he was to see PCP and neuro eval when last saw him in [place], but he has had none since using a walker    t here has been progressive orthopnea and nocturnal dyspnea which has been attributed in part to   Cirrhosis   Past Medical History  Diagnosis Date  . Ischemic cardiomyopathy     a. 2010 s/p MDT Concerto II CRT-D, [id number]  . Chronic systolic heart failure     a. 06/979 Echo: EF 20-25%, Diff HK, Post AK, Triv AI, Mild MR, Mod Tr  . Automatic implantable cardiac defibrillator in situ     a. 2010 s/p MDT Concerto II CRT-D, [id number]  . Hyperlipidemia   . Hypertension   . Unspecified disorder resulting from impaired renal function   . Osteoarthrosis, unspecified whether generalized or localized, unspecified site   . Esophageal reflux   . Obstructive sleep apnea (adult) (pediatric)   . Tricuspid regurgitation     a. Mod by echo 04/2009  . CAD (coronary artery disease)     a. s/p CABG and Redo CABG in [place] with additional Redo @ [facility] approx 2008 (Followed by Dr. Gwen Pounds in [place])  . Anemia     a. s/p transfusion and EGD 08/2011 w/o finding of bleeding.  . Broken humerus 01/26/12  . Thyroid disease     hypo    Past Surgical History  Procedure Laterality Date  . Insert / replace / remove pacemaker      CRT-D  . Coronary artery bypass graft    . Total hip  arthroplasty      Current Outpatient Prescriptions  Medication Sig Dispense Refill  . aspirin 81 MG EC tablet Take 81 mg by mouth daily.        . calcium carbonate (TUMS - DOSED IN MG ELEMENTAL CALCIUM) 500 MG chewable tablet Chew 1 tablet by mouth daily.        . DULoxetine (CYMBALTA) 20 MG capsule Take 20 mg by mouth daily.      . Ferrous Sulfate (IRON) 325 (65 FE) MG TABS Take by mouth. Takes 2 tablets daily.      . furosemide (LASIX) 40 MG tablet Take 40 mg by mouth 2 (two) times daily.       Marland Kitchen HYDROcodone-acetaminophen (NORCO/VICODIN) 5-325 MG per tablet as needed.       Marland Kitchen levothyroxine (SYNTHROID, LEVOTHROID) 25 MCG tablet Take 25 mcg by mouth daily.      . Misc Natural Products (GLUCOSAMINE CHONDROITIN ADV) TABS Take by mouth.        . morphine (MSIR) 30 MG tablet Take 30 mg by mouth 3 (three) times daily.       . Multiple Vitamins-Minerals (CENTRUM SILVER PO) Take by mouth.        . nitroGLYCERIN (NITROSTAT) 0.4 MG SL tablet Place 0.4 mg under the tongue every 5 (five) minutes as needed.        Marland Kitchen  Omega-3 Fatty Acids (FISH OIL) 1200 MG CAPS Take 1,200 mg by mouth 2 (two) times daily.        Marland Kitchen omeprazole (PRILOSEC) 20 MG capsule Take 20 mg by mouth 2 (two) times daily.       Marland Kitchen oxyCODONE-acetaminophen (PERCOCET) 10-325 MG per tablet Take 1 tablet by mouth every 4 (four) hours as needed.        Marland Kitchen PROAIR HFA 108 (90 BASE) MCG/ACT inhaler 2 puffs as needed.       . senna (SENOKOT) 8.6 MG tablet Take 1 tablet by mouth daily.      . sennosides-docusate sodium (SENOKOT-S) 8.6-50 MG tablet Take 1 tablet by mouth daily.        Marland Kitchen spironolactone (ALDACTONE) 25 MG tablet Take 25 mg by mouth daily.      . tamsulosin (FLOMAX) 0.4 MG CAPS 0.4 mg daily.        No current facility-administered medications for this visit.    No Known Allergies  Review of Systems negative except from HPI and PMH  Physical Exam BP 98/64  Pulse 104  Ht 5\' 9"  (1.753 m)  Wt 175 lb (79.379 kg)  BMI 25.83  kg/m2 Well developed and well nourished in no acute distress HENT normal E scleral and icterus clear Neck Supple JVP greater than 10carotids brisk and full Clear to ausculation  Regular rate and rhythm, no murmurs gallops or rub Soft with active bowel sounds No clubbing cyanosis 3+ Edema Alert and oriented, grossly normal motor and sensory function Skin Warm and Dry Sinus rhythm with a synchronous pacing at a rate of 103   Assessment and  Plan

## 2012-06-23 NOTE — Patient Instructions (Addendum)
Your physician wants you to follow-up in: 8 weeks with Dr. Graciela Husbands. You will receive a reminder letter in the mail two months in advance. If you don't receive a letter, please call our office to schedule the follow-up appointment.  Your physician has recommended you make the following change in your medication:  Start zaroxolyn 2.5 mg 3 times a week

## 2012-06-23 NOTE — Assessment & Plan Note (Signed)
He had a lengthyr discussion today regarding ERI options.  Specifically we discussed doing nothing, replacement CRT, replacing the ICD. We discussed the attenuation benefit of he is released ICD in the impact of comorbidity. We needed prognosis related to his comorbidities prior to proceeding with ICD implantation.

## 2012-06-23 NOTE — Assessment & Plan Note (Signed)
The patient has significant volume overload. He has nocturnal dyspnea and orthopnea. He has some cirrhosis apparently. Jugular venous distention however suggested with a significant portion of this is cardiac. There has been a variety of medication efforts without significant improvement. I will go ahead and add Zaroxolyn 2.5 mg 3 times a week. Blood work from the outside demonstrated creatinine 1.3 with the last month  I suggested that if the symptoms do not improve, he may well be better served by a heart failure service with possible inpatient admission consideration right heart catheterization and possible inotropes.

## 2012-06-27 ENCOUNTER — Telehealth: Payer: Self-pay | Admitting: *Deleted

## 2012-06-27 NOTE — Telephone Encounter (Signed)
Pt wife called wanting to know if pt can have the warning beep turned off his pace maker. Pt doesn't want to have it replaced at this time.

## 2012-06-27 NOTE — Telephone Encounter (Signed)
Spoke w/wife and scheduled pt to come in [place] office on 06-30-12 at 1330 for alert to be turned off.

## 2012-06-28 ENCOUNTER — Ambulatory Visit: Payer: Self-pay | Admitting: Internal Medicine

## 2012-06-29 LAB — CBC CANCER CENTER
Eosinophil %: 1.5 %
HCT: 28.4 % — ABNORMAL LOW (ref 40.0–52.0)
Lymphocyte #: 0.9 x10 3/mm — ABNORMAL LOW (ref 1.0–3.6)
Lymphocyte %: 18.7 %
Monocyte %: 15.7 %
Neutrophil #: 3 x10 3/mm (ref 1.4–6.5)
Platelet: 128 x10 3/mm — ABNORMAL LOW (ref 150–440)
RBC: 3.08 10*6/uL — ABNORMAL LOW (ref 4.40–5.90)
WBC: 4.7 x10 3/mm (ref 3.8–10.6)

## 2012-06-30 ENCOUNTER — Ambulatory Visit (INDEPENDENT_AMBULATORY_CARE_PROVIDER_SITE_OTHER): Payer: Medicare Other | Admitting: *Deleted

## 2012-06-30 ENCOUNTER — Ambulatory Visit: Payer: Self-pay | Admitting: Internal Medicine

## 2012-06-30 ENCOUNTER — Other Ambulatory Visit: Payer: Self-pay

## 2012-06-30 ENCOUNTER — Ambulatory Visit: Payer: Medicare Other | Admitting: Internal Medicine

## 2012-06-30 ENCOUNTER — Encounter: Payer: Self-pay | Admitting: Internal Medicine

## 2012-06-30 DIAGNOSIS — I2589 Other forms of chronic ischemic heart disease: Secondary | ICD-10-CM

## 2012-06-30 DIAGNOSIS — I5022 Chronic systolic (congestive) heart failure: Secondary | ICD-10-CM

## 2012-06-30 DIAGNOSIS — I255 Ischemic cardiomyopathy: Secondary | ICD-10-CM

## 2012-06-30 LAB — ICD DEVICE OBSERVATION
BAMS-0001: 150 {beats}/min
TZAT-0001ATACH: 1
TZAT-0001ATACH: 2
TZAT-0001SLOWVT: 2
TZAT-0002ATACH: NEGATIVE
TZAT-0004SLOWVT: 8
TZAT-0004SLOWVT: 8
TZAT-0005SLOWVT: 88 pct
TZAT-0005SLOWVT: 91 pct
TZAT-0012ATACH: 150 ms
TZAT-0012ATACH: 150 ms
TZAT-0012SLOWVT: 200 ms
TZAT-0013SLOWVT: 2
TZAT-0013SLOWVT: 2
TZAT-0018ATACH: NEGATIVE
TZAT-0018ATACH: NEGATIVE
TZAT-0018FASTVT: NEGATIVE
TZAT-0019ATACH: 6 V
TZAT-0019FASTVT: 8 V
TZAT-0020ATACH: 1.5 ms
TZAT-0020FASTVT: 1.5 ms
TZON-0003ATACH: 400 ms
TZON-0003SLOWVT: 350 ms
TZON-0004SLOWVT: 20
TZON-0004VSLOWVT: 32
TZST-0001ATACH: 4
TZST-0001FASTVT: 2
TZST-0001FASTVT: 6
TZST-0001SLOWVT: 3
TZST-0001SLOWVT: 6
TZST-0002ATACH: NEGATIVE
TZST-0002FASTVT: NEGATIVE
TZST-0002FASTVT: NEGATIVE
TZST-0003SLOWVT: 15 J
TZST-0003SLOWVT: 25 J
TZST-0003SLOWVT: 35 J

## 2012-06-30 NOTE — Progress Notes (Signed)
ICD detections and alerts turned off per Dr.Klein

## 2012-07-01 ENCOUNTER — Telehealth: Payer: Self-pay | Admitting: *Deleted

## 2012-07-01 NOTE — Telephone Encounter (Signed)
Spoke with wife and she mentioned that her husbands device alert has went off again this morning. Spoke with paula and she mentioned that she would have to see pt to turn device off. Pt is aware to come to [facility] office around 1:00 paula will cut device off at that time.

## 2012-08-08 ENCOUNTER — Other Ambulatory Visit: Payer: Self-pay

## 2012-08-08 MED ORDER — SPIRONOLACTONE 25 MG PO TABS: 25.0000 mg | ORAL_TABLET | Freq: Every day | ORAL | Status: AC

## 2012-08-08 NOTE — Telephone Encounter (Signed)
Refill sent for aldactone 25 mg

## 2012-08-29 ENCOUNTER — Encounter: Payer: Medicare Other | Admitting: Internal Medicine

## 2012-09-30 DEATH — deceased

## 2012-10-11 ENCOUNTER — Encounter: Payer: Medicare Other | Admitting: Internal Medicine

## 2013-06-06 IMAGING — US ABDOMEN ULTRASOUND LIMITED
1 series · 14 of 25 positions shown · non-contrast
Comparison: none

REASON FOR EXAM: abd pain Pancreas liver spleen   eval for cirrhosis
splenomegaly
COMMENTS:

[Series 1: abdomen ultrasound limited · 0.20mm/px · 14 of 64 slices shown]
[im 1/64]
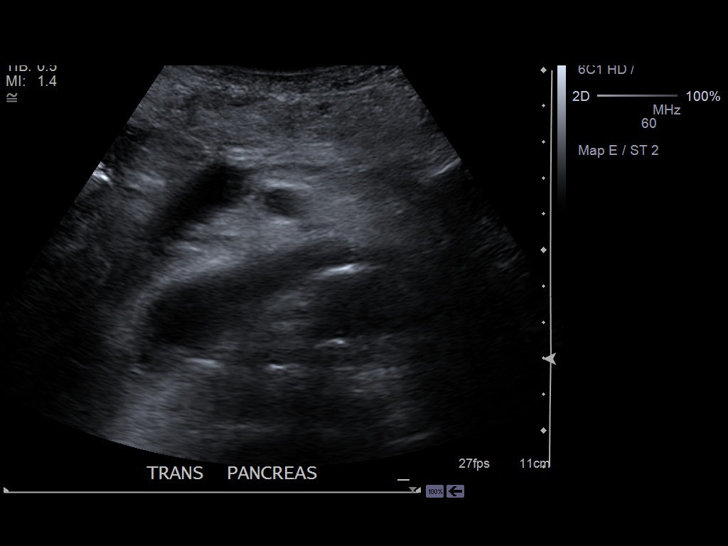
[im 6/64]
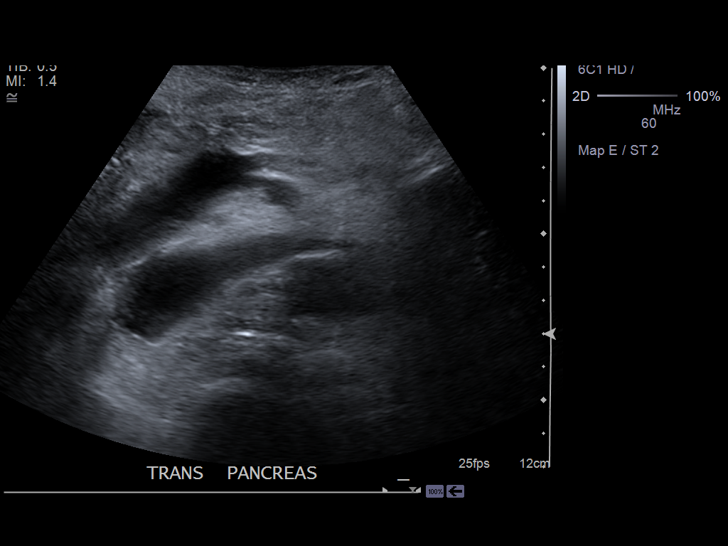
[im 11/64]
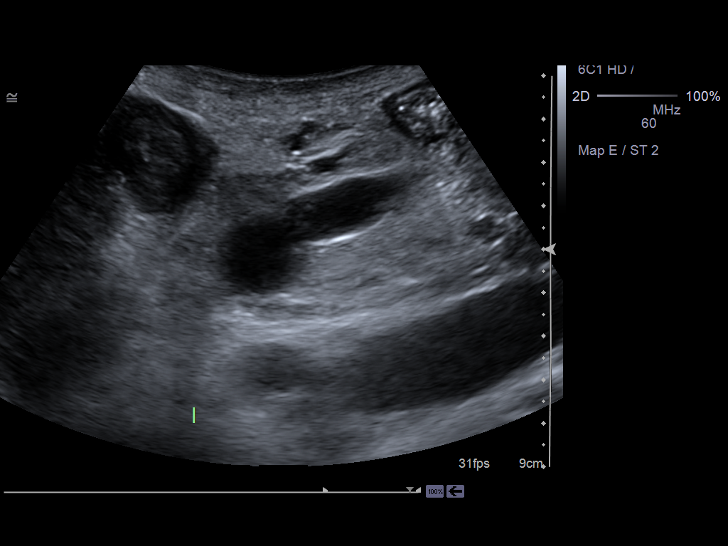
[im 16/64]
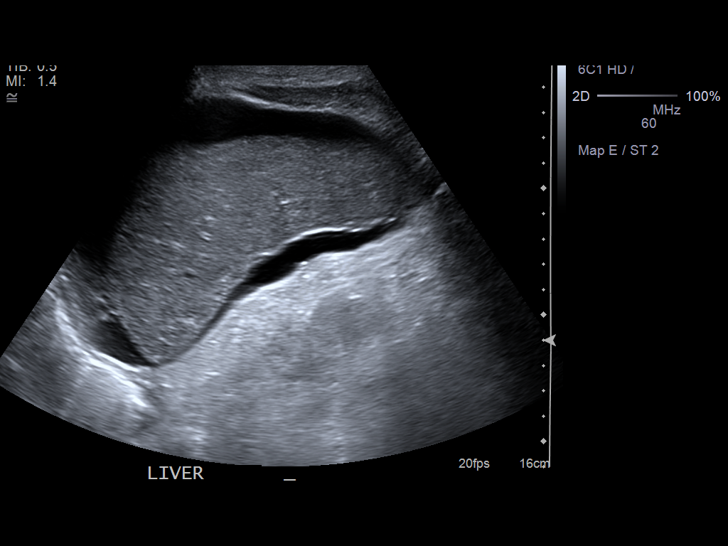
[im 22/64]
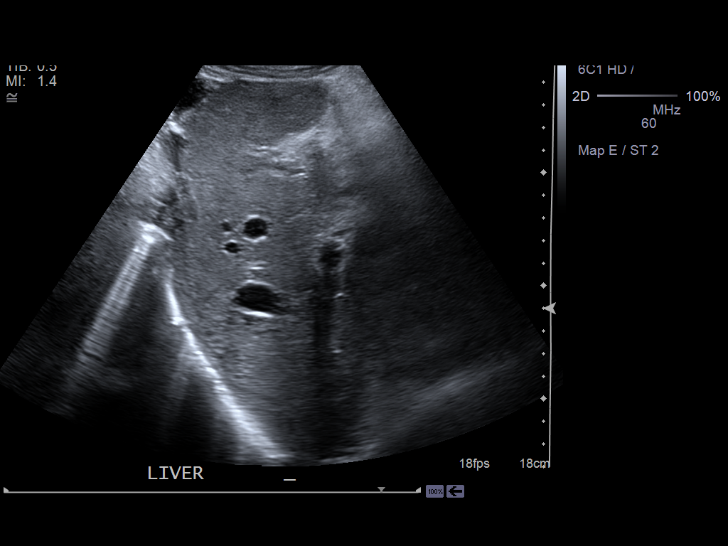
[im 24/64]
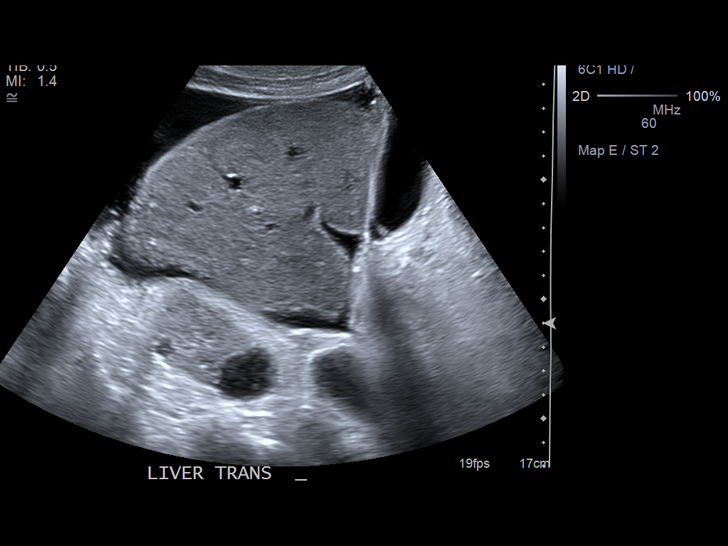
[im 29/64]
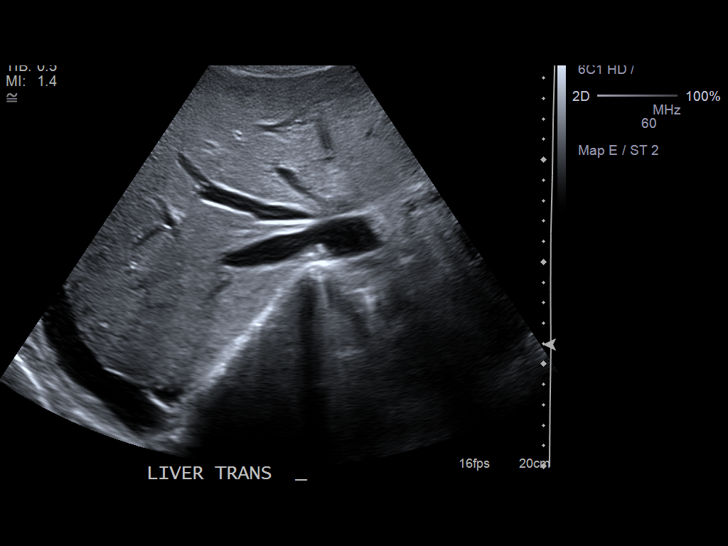
[im 35/64]
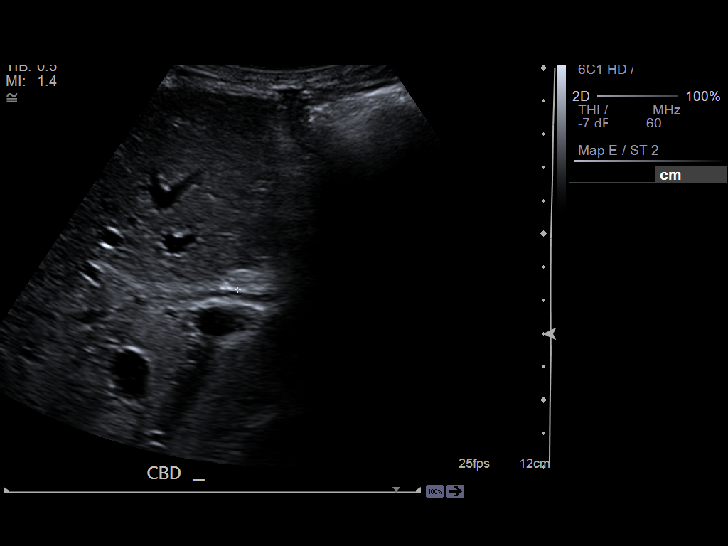
[im 40/64]
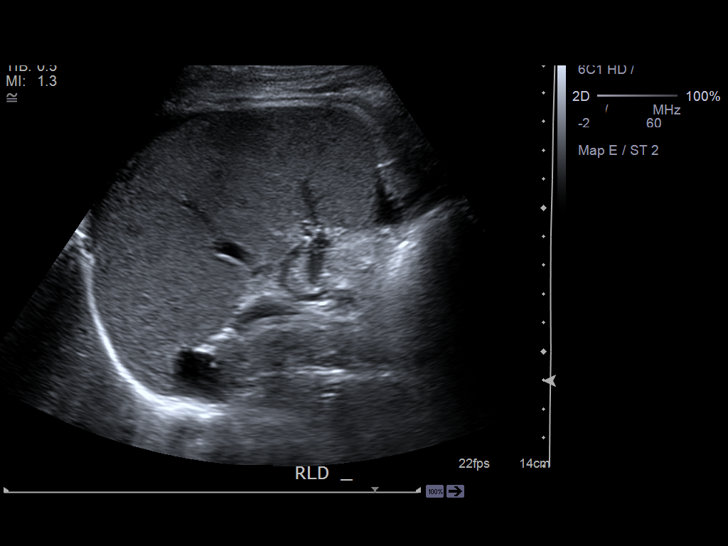
[im 43/64]
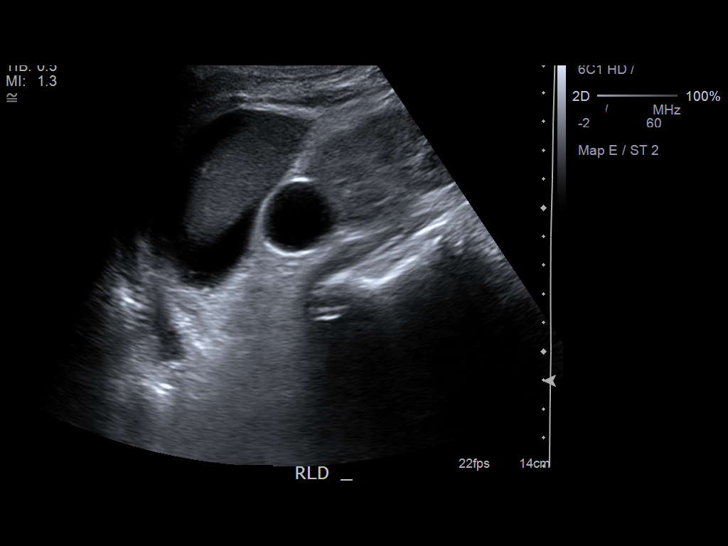
[im 48/64]
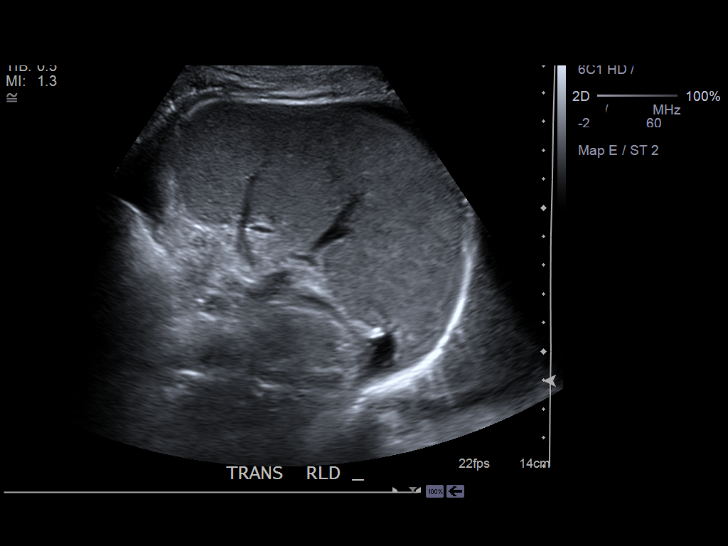
[im 53/64]
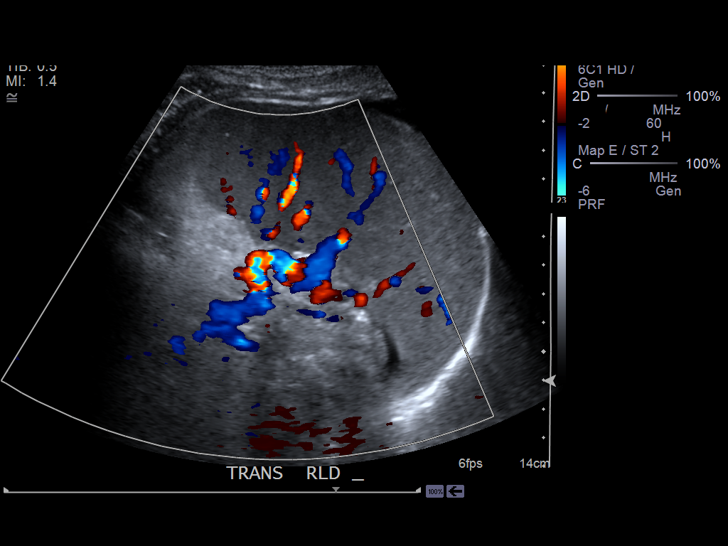
[im 58/64]
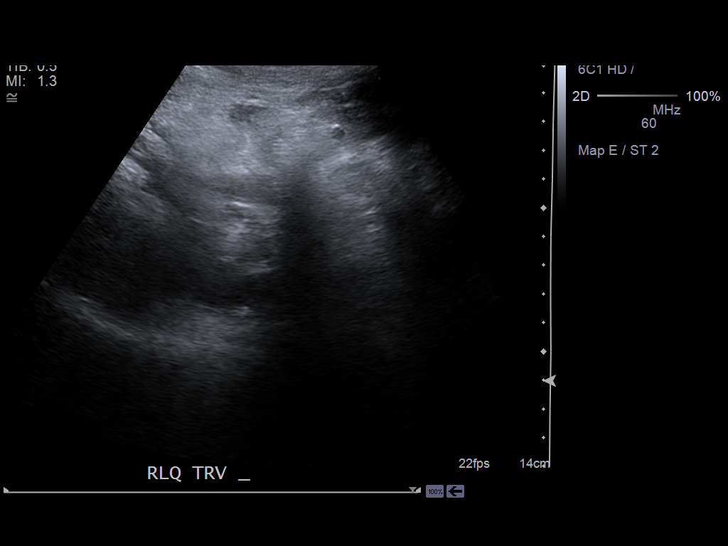
[im 64/64]
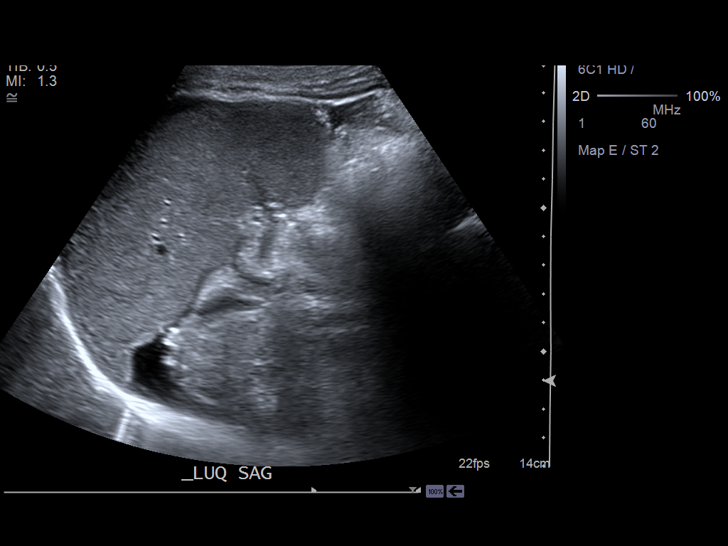

[14 of 25 positions shown; findings below may reference images not displayed]

PROCEDURE:     US  - US ABDOMEN LIMITED SURVEY  - October 16, 2011  [DATE]

RESULT:     A limited right upper quadrant ultrasound was performed.

The liver exhibits increased echotexture and an irregular surface and
overall decreased volume suggesting cirrhosis. There is no discrete mass.
Portal venous flow appears normal in direction toward the liver. The common
bile duct measures 3 mm in diameter. Ascites is present in the upper
abdomen. The spleen measures 11.3 cm in greatest dimension. Evaluation the
pancreas was limited due to bowel gas.
IMPRESSION: There are findings which likely reflect cirrhosis of the
liver. There is surrounding ascites. No focal mass is demonstrated. There is
no splenomegaly. Followup CT scanning would be useful.

[REDACTED]

## 2013-12-10 IMAGING — CT CT CHEST W/ CM
1 of 3 series · 12 of 30 positions shown, 15 images · non-contrast
Comparison: none

REASON FOR EXAM: distended veins Arm swelling CHF
COMMENTS:

[Series 2: chest w/ 3.0 i31f 2 · axial · 0.98mm/px · z∈[-701,-398]mm · 12 of 121 slices shown, 15 images]
[im 10/121  mediastinal]
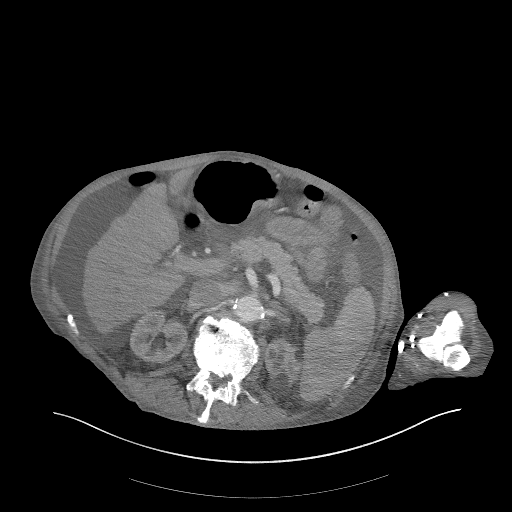
[im 10/121  lung]
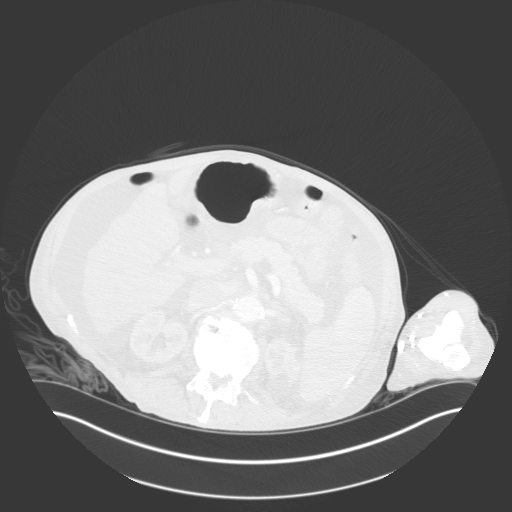
[im 19/121  lung]
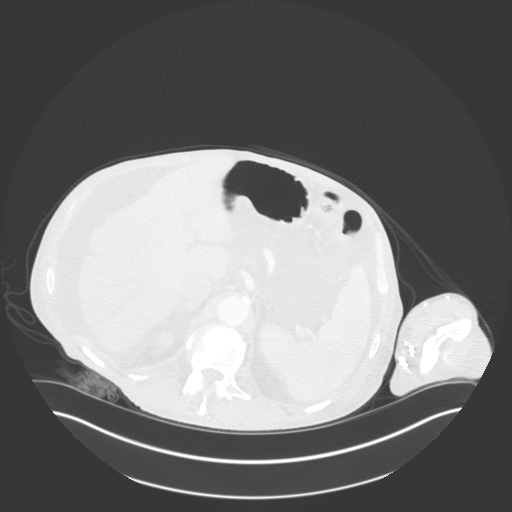
[im 28/121  lung]
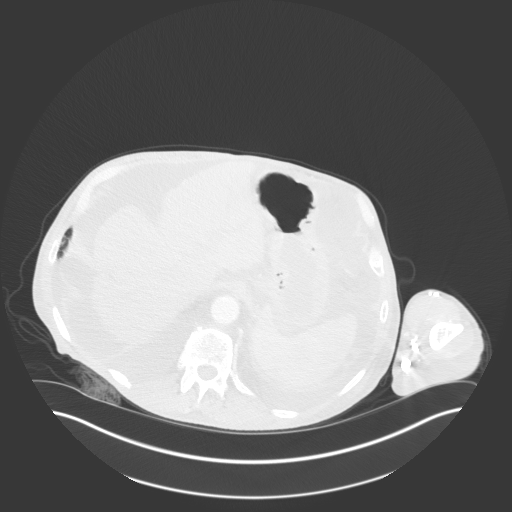
[im 37/121  lung]
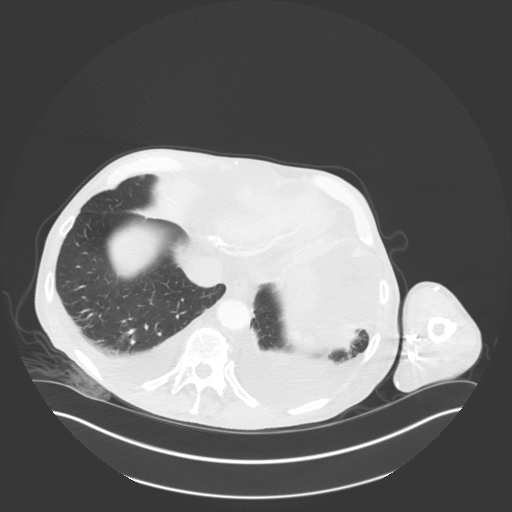
[im 47/121  mediastinal]
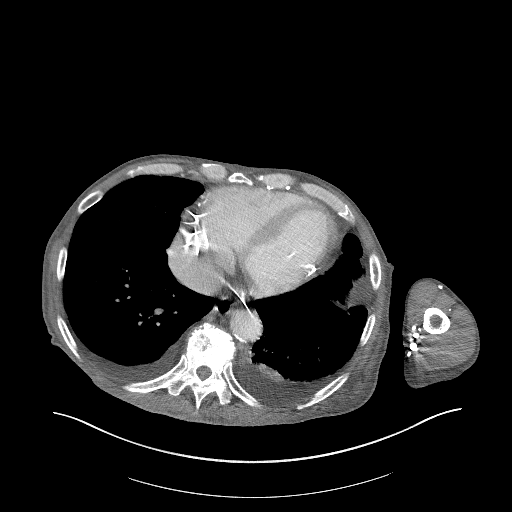
[im 47/121  lung]
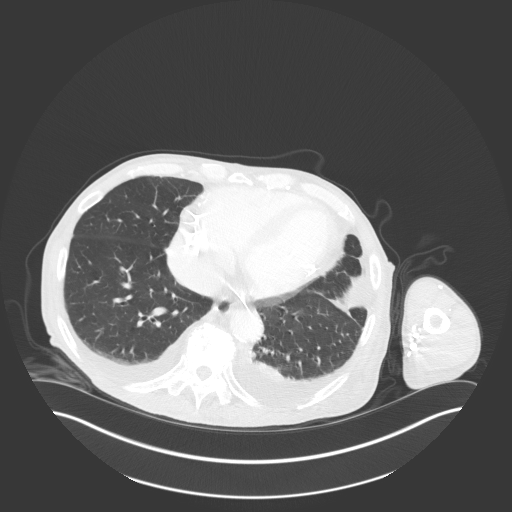
[im 56/121  lung]
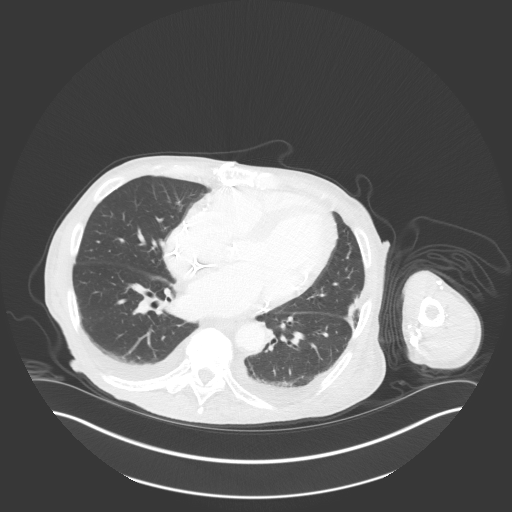
[im 65/121  lung]
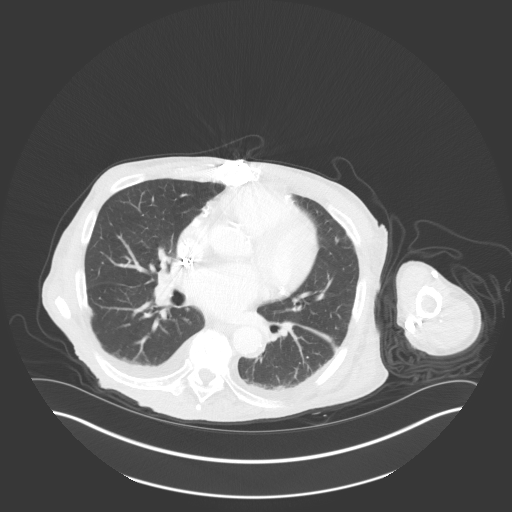
[im 74/121  lung]
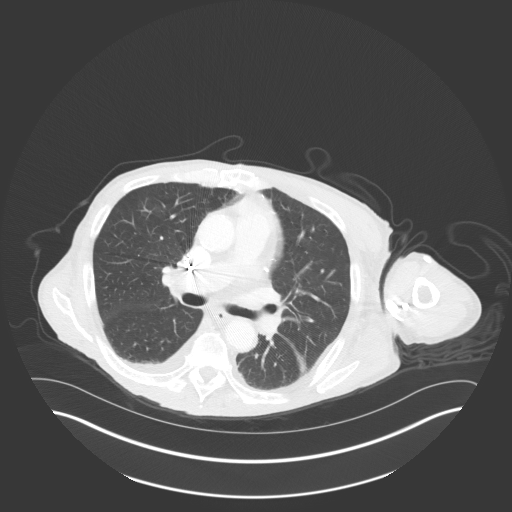
[im 84/121  mediastinal]
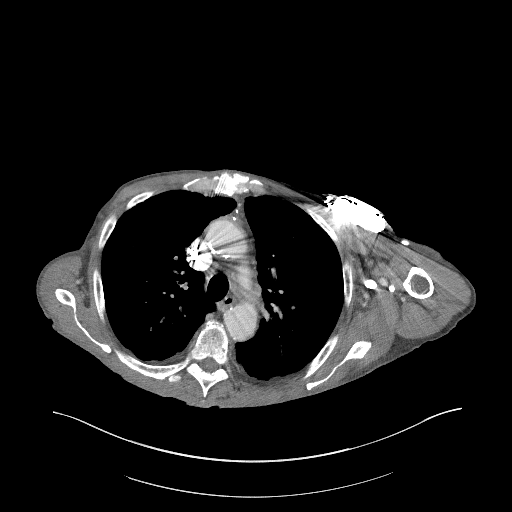
[im 84/121  lung]
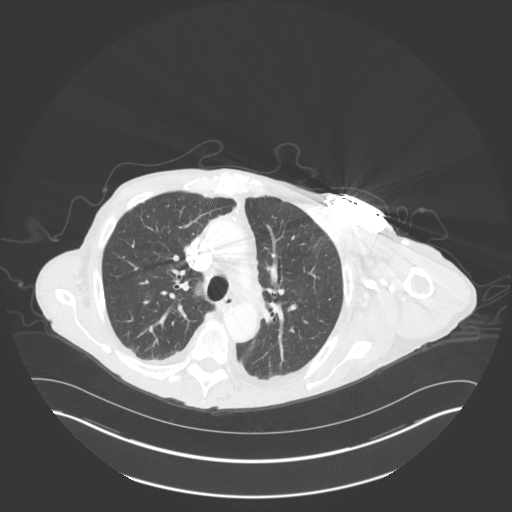
[im 93/121  lung]
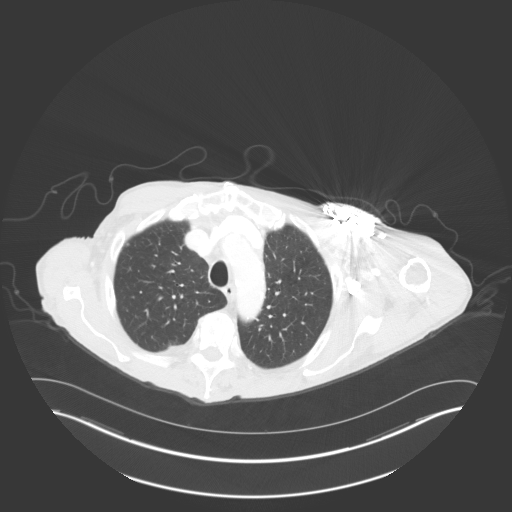
[im 102/121  lung]
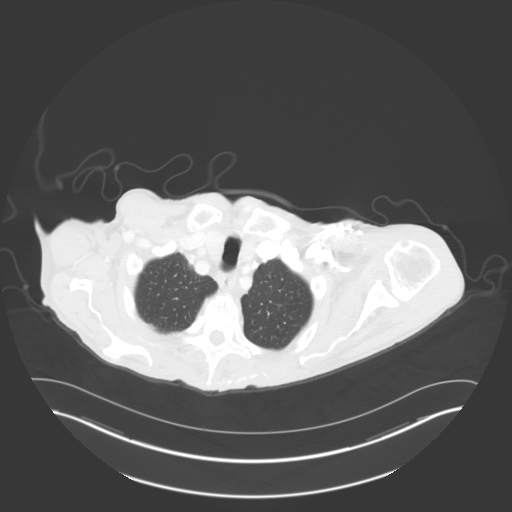
[im 111/121  lung]
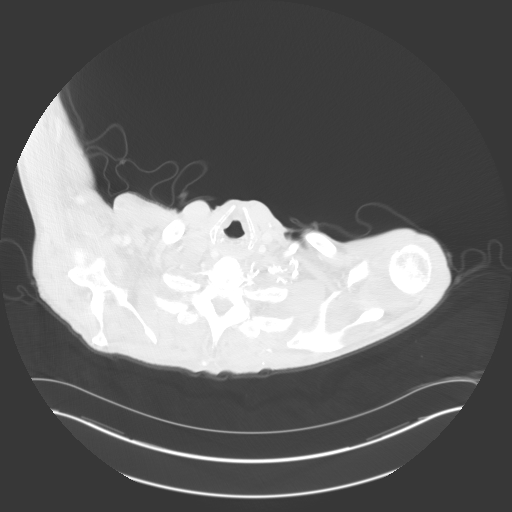

[12 of 30 positions shown; findings below may reference images not displayed]

PROCEDURE:     KCT - KCT CHEST WITH CONTRAST  - April 20, 2012  [DATE]

RESULT:     CT of the chest with 75 mL of 6sovue-CTI iodinated intravenous
contrast is reconstructed in the axial plane at 3.0 mm slice thickness. The
patient has no previous similar study for comparison.

Small bilateral pleural effusions are present. Ascites is demonstrated.
There appears to be a parapelvic cyst in the right kidney. This is
incompletely included on the study. There appears to be fracture in the
proximal right humerus. Radiographic images from January 2012 also
demonstrate a comminuted, nondisplaced proximal right humerus fracture.

The thoracic aorta is normal in caliber. Atherosclerotic calcification is
present. The heart is borderline enlarged. No large central filling defect
is seen in the pulmonary arteries. The contrast bolus is suboptimal to
evaluate pulmonary emboli distally. The spleen included on the study appears
unremarkable. The liver shows some nodularity. Is there a history of
cirrhosis? No definite radiopaque gallstones are seen. There is no definite
pulmonary parenchymal mass. There is no infiltrate or pneumothorax evident.
The bony structures are otherwise osteopenic but unremarkable.
IMPRESSION: 1. Proximal right humeral fracture.
2. Ascites and pleural effusions.
3. Findings suggestive of cirrhosis. Correlate clinically.
4. Probable right renal parapelvic cy[REDACTED]

## 2014-06-19 NOTE — Discharge Summary (Signed)
PATIENT NAME:  Randall David, Randall David MR#:  [id number] DATE OF BIRTH:  February 24, 1933  DATE OF ADMISSION:  09/14/2011 DATE OF DISCHARGE:  09/16/2011  DISCHARGE DIAGNOSES:  1. Symptomatic iron deficiency anemia.  2. Dehydration with mild hypotension.  3. Probable gastrointestinal bleed.  4. Cardiomyopathy/arteriosclerotic cardiovascular disease, ejection fraction 25%.  5. Peripheral vascular disease.  6. Chronic atrial fibrillation.  7. Recurrent gastric ulcers.  8. Liver cirrhosis.  9. Benign prostatic hypertrophy.   DISCHARGE MEDICATIONS:  1. Levothyroxine 25 mcg daily.  2. Morphine SR 30 mg b.i.d.  3. Senna daily.  4. Percocet 5/325 b.i.d. p.r.n.  5. Spironolactone 25 mg daily.  6. Citalopram 40 mg daily.  7. Aspirin 81 mg daily.  8. Multivitamin daily.  9. Furosemide 40 mg daily. 10. Protonix 40 mg b.i.d.  11. Metoprolol tartrate 25 mg half b.i.d.  12. Vitron C daily.   REASON FOR ADMISSION: 81 year old male presents with weakness and volume depletion and symptomatic anemia with hemoglobin 6.5. Please see history and physical for history of present illness, past medical history, and physical exam.   HOSPITAL COURSE: Patient was admitted, hydrated, transfused with 1 unit PRBC. His renal dysfunction with creatinine improving from to 2.1 to 1.2. Hemoglobin remained stable at 2.0. Ferritin and iron levels low. EGD is pending. His Flomax was held. His metoprolol dose cut in half and omeprazole switched to Protonix as gastric etiology is most likely. Hopefully a little more blood pressure and a little less medicine with improved hydration will help him reduce and minimize his falls and weakness which has been substantially limiting to him of recent. He will be on Vitron C for iron replacement. Will follow up with Dr. Marcello FennelHande in one week. Will need blood count then.   ____________________________ Danella PentonMark F. Miller, MD mfm:cms D: 09/16/2011 08:14:02 ET T: 09/16/2011 12:41:56  ET JOB#: [id number]  cc: Danella PentonMark F. Miller, MD, <Dictator> MARK Sherlene ShamsF MILLER MD ELECTRONICALLY SIGNED 09/17/2011 8:15

## 2014-06-19 NOTE — H&P (Signed)
PATIENT NAME:  Randall David, Randall David MR#:  [id number] DATE OF BIRTH:  Feb 26, 1933  DATE OF ADMISSION:  09/14/2011  PRIMARY CARE PHYSICIAN: Dr. Marcello Fennel   CHIEF COMPLAINT: Sent in for low blood count.   HISTORY OF PRESENT ILLNESS: This is a 81 year old man with multiple medical issues. He had routine blood work today, found to have a hemoglobin of 6.7 and was sent in to the ER for further evaluation. The patient has been having a lot of falls recently. He states that his health has been going downhill since he had a TIA a couple of months back. He has been having a lot of falls recently. He fell 10 to 12 times over the past few months. Dr. Marcello Fennel has been adjusting his blood pressure medications and monitoring his potassium and water. The patient states that he had an EGD and colonoscopy about five years ago which was okay but 6 to 8 years ago he did have a bleeding ulcer. He does have skin tears from all the falling that he has and he's been having left shoulder pain and difficulty moving the left arm. He usually does walk with a walker and a cane. He says that he could stand up and fall and occasionally while he is walking the room does spin and he does fall. As per the wife, he has been having periods of confusion and short-term memory loss. No bright red blood per rectum. No melena. No blood in the urine that he sees. Looking back at prior [facility] labs, he has been anemic in the past also with a hemoglobin of 8.1 back in May.   PAST MEDICAL HISTORY:  1. Coronary artery disease. 2. Congestive heart failure. 3. Chronic obstructive pulmonary disease. 4. Hypertension.  5. Hyperlipidemia.  6. Benign prostatic hypertrophy. 7. Left bundle branch block. 8. Depression. 9. History of gastric ulcer in the past. 10. Anemia.   PAST SURGICAL HISTORY:  1. Three hip replacements.  2. Three bypass surgeries.  3. Pacemaker defibrillator.   ALLERGIES: No known drug allergies.   MEDICATIONS AS PER  PRESCRIPTION WRITER:  1. Percocet 1 tablet twice a day as needed for pain.  2. Aspirin 81 mg daily.  3. Celexa 40 mg daily.  4. Co-Q10 2 capsules in the morning. 5. Colace 200 mg daily.  6. Lasix p.r.n. for fluid. 7. Glucosamine/chondroitin daily.  8. Levothyroxine 25 mcg daily.  9. Metoprolol 25 mg twice a day. 10. Morphine ER 30 mg twice a day.  11. Multivitamin daily.  12. NitroQuick p.r.n.  13. Omega-3 two in the morning.  14. Omeprazole 20 mg twice a day.  15. Senna 2 tablets daily.  16. Spironolactone 25 mg daily.  17. Flomax 0.4 mg daily.  18. TUMS p.r.n.   SOCIAL HISTORY: Lives with his wife. Nonsmoker, quit 20 years ago. No alcohol. No drug use. 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   FAMILY HISTORY: Father died at 4 of a brain tumor. Mother died at 30 of heart and kidney issues.   REVIEW OF SYSTEMS: CONSTITUTIONAL: Positive for chills and feeling cold. No sweats. No fever. Positive for weakness. No strength in the left hand, left shoulder and pain rib pain. EARS, NOSE, MOUTH, AND THROAT: Decreased hearing, sometimes vertigo. Positive for runny nose. No difficulty swallowing. No sore throat. CARDIOVASCULAR: Occasional chest pain. RESPIRATORY: Occasional shortness of breath. No coughing. No sputum. No hemoptysis. GASTROINTESTINAL: Occasional abdominal pain. No nausea. No vomiting. Occasional constipation and diarrhea. No bright red blood per rectum.  No melena. GENITOURINARY: Sometimes burning on urination. Sometimes dark urine. MUSCULOSKELETAL: Positive for left shoulder pain. Difficulty moving her right arm, rib pain. NEUROLOGIC: No fainting or blackouts but unsteady with walking. INTEGUMENTARY: Positive for skin tears. PSYCHIATRIC: Positive for depression. ENDOCRINE: Positive for hypothyroidism. HEMATOLOGIC/LYMPHATIC: History of anemia.   PHYSICAL EXAMINATION:   VITAL SIGNS: Temperature 98.5, pulse 97, respirations 18, blood pressure 93/55, pulse oximetry 97%.   GENERAL: No  respiratory distress.   EYES: Conjunctivae pale. Lids normal. Pupils equal, round, and reactive to light. Extraocular muscles intact. No nystagmus.   EARS, NOSE, MOUTH, AND THROAT: Tympanic membranes no erythema. Nasal mucosa no erythema. Throat no erythema. No exudate seen. Lips and gums no lesions.   NECK: No JVD. No bruits. No lymphadenopathy. No thyromegaly. No thyroid nodules palpated.   RESPIRATORY: Lungs clear to auscultation. No use of accessory muscles to breathe. No rhonchi, rales, or wheeze heard.   CARDIOVASCULAR: S1, S2 normal. +2/6 systolic ejection murmur. Carotid upstroke 2+ bilaterally. No bruits. Dorsalis pedis pulses 1+ bilaterally. 3+ edema bilateral lower extremity.   ABDOMEN: Soft, nontender. No organomegaly/splenomegaly. Normoactive bowel sounds.   LYMPHATIC: No lymph nodes in the neck.   MUSCULOSKELETAL: 3+ edema. No clubbing. No cyanosis.   SKIN: Multiple skin tears on left arm.   NEUROLOGIC: Cranial nerves II through XII grossly intact. Deep tendon reflexes 2+ bilateral lower extremity. Power 5/5 bilateral lower extremities. Decreased range of motion. Left shoulder pain on palpating over the left shoulder.   PSYCHIATRIC: The patient is oriented to person and place.   LABORATORY AND RADIOLOGICAL DATA: White blood cell count 3.9, hemoglobin and hematocrit 6.3 and 20.1, platelet count 121, glucose 105, BUN 39, creatinine 1.46, sodium 141, potassium 3.9, chloride 106, CO2 26, calcium 8.2, alkaline phosphatase 149, ALT 22, AST 43, albumin 3.0. Troponin negative.   EKG paced, 73 beats per minute.   ASSESSMENT AND PLAN:  1. Anemia. The patient was guaiac-negative by ER physician. With the patient's history of gastric ulcers, will put empirically on Protonix. Will repeat a guaiac when he has a bowel movement. Will send off iron studies to help out with the etiology of the anemia. Will transfuse 1 unit of packed red blood cells over four hours since the patient does have  a history of congestive heart failure giving Lasix afterwards. Serial hemoglobins. Hold aspirin at this point.  2. Frequent falls. The patient cannot have an MRI of the brain. Unclear etiology. Will get a CAT scan of the head. Check orthostatic vital signs. See how he does with physical therapy.  3. Relative hypotension. Will decrease the dose of metoprolol withholding parameters. Continue the Flomax for right now. Hold Lasix initially with the creatinine being slightly up.  4. Coronary artery disease. Hold aspirin at this time. Metoprolol withholding parameters. No signs of heart disease at this time or congestive heart failure at this time.  5. History of TIA. Hold aspirin with the anemia.  6. Left shoulder pain. Decreased movement, pain with palpation, most likely bursitis with a frozen shoulder. Will start off with x-rays. Not going to give any anti-inflammatories at this point with the anemia.  7. Chronic pain. The patient is on Percocet and morphine. The morphine may be contributing to the patient's falls. Will leave this up to Dr. Marcello FennelHande on whether he wants to decrease this or not.  8. Chronic kidney disease, stage III. Creatinine is actually better than the last time. Need to be careful with the Lasix. I will give Lasix  after the blood transfusion and will have to re-evaluate the Lasix for tomorrow morning.   TIME SPENT ON ADMISSION: 55 minutes.      CODE STATUS: The patient is a DO NOT RESUSCITATE.    ____________________________ Herschell Dimes. Renae Gloss, MD rjw:drc D: 09/14/2011 14:22:57 ET T: 09/14/2011 14:33:11 ET JOB#: [id number]  cc: Herschell Dimes. Renae Gloss, MD, <Dictator> Barbette Reichmann, MD Salley Scarlet MD ELECTRONICALLY SIGNED 09/19/2011 16:03

## 2014-06-19 NOTE — Consult Note (Signed)
Brief Consult Note: Diagnosis: Anemia.   Patient was seen by consultant.   Comments: Iron deficiency anemia. EGD in 2010 showed a gastric ulcer. Colonoscopy in 2010 showed internal hemorrhoids. No signs of active bleeding and he is heme negative.  Impression: Transfuse as he appears to be symptomatic. Will make him NPO after midnight as he might benefit from a repeat EGD prior to discharge. Will make that decision tomorrow depending on his course overnight. Will follow.  Electronic Signatures: Lurline DelIftikhar, Ezel Vallone (MD)  (Signed 16-Jul-13 18:35)  Authored: Brief Consult Note   Last Updated: 16-Jul-13 18:35 by Lurline DelIftikhar, Janeann Paisley (MD)

## 2014-06-24 NOTE — Discharge Summary (Signed)
PATIENT NAME:  Randall Randall David, Randall Randall David MR#:  [id number] DATE OF BIRTH:  1932-08-31  DATE OF ADMISSION:  04/02/2011 DATE OF DISCHARGE:  04/03/2011  DIAGNOSES AT THE TIME OF DISCHARGE:  1. Left lower extremity weakness, most likely transient ischemic attack.  2. Acute on chronic kidney disease with mild dehydration.  3. Hypertension.  4. Coronary artery disease.  5. Hyperlipidemia.  6. Benign prostatic hypertrophy.  CHIEF COMPLAINT: Left lower extremity weakness.   HISTORY OF PRESENT ILLNESS: Mr. Randall Randall David is a 81 year old male with multiple medical problems including atherosclerotic heart disease, congestive heart failure, chronic obstructive pulmonary disease, coronary artery disease, hypertension, and hyperlipidemia who presented initially with weakness associated with difficulty in getting up. The patient states that his weakness was primarily in the left leg associated with numbness. This occurred when he was at his ophthalmologist appointment and tried to get up from his seat. The patient also was noted to be dehydrated with a creatinine of 2.1 with baseline creatinine, creatinine being between 1.4 and 1.7.   PAST MEDICAL HISTORY:  1. Atherosclerotic heart disease. 2. Congestive heart failure. 3. Chronic obstructive pulmonary disease. 4. Coronary artery disease, status post coronary artery bypass graft.  5. Hypertension.  6. Hyperlipidemia.  7. Benign prostatic hypertrophy.  8. Hip replacement x2.  9. History of left bundle branch block.  10. Depression. 11. Gastric ulcers.  12. Anemia. 13. Right hip replacement. 14. Right hip surgery.  PHYSICAL EXAMINATION: He was awake, alert, and oriented. Afebrile, pulse 76, blood pressure 115/60, oxygen sat 99% on room air. HEENT: Normocephalic, atraumatic. Pupils equal and reactive to light. HEART: S1, S2. LUNGS: Clear to auscultation. ABDOMEN: Soft, nontender. EXTREMITIES: No edema. NEUROLOGIC: He was alert and oriented x3. Speech was clear. No  obvious deficits were noted. He was able to ambulate, although he appeared to be somewhat unsteady.  LABORATORY, DIAGNOSTIC, AND RADIOLOGICAL DATA: Hemoglobin 9.6, hematocrit 28.9, platelets 99, WBC count 3.7, glucose 131, BUN 44, creatinine 2.11, sodium 142, potassium 4.2, chloride 103, bicarb 31, calcium 8.6, SGOT 62, total protein 7.1. Troponin 0.03. CT of the head without contrast did not show any acute intracranial process. There was evidence of chronic small vessel ischemic disease.   HOSPITAL COURSE: [name] and received IV hydration. His serum creatinine did improve to 1.83 and the patient symptomatically felt better. His weakness also improved to a significant extent and he was ambulated without any difficulty. The patient's chest x-ray did show evidence of cardiomegaly with pacemaker device and CABG changes. No acute cardiopulmonary disease was evident. There was slight elevation of the lateral portion of the left hemidiaphragm and appeared to be unchanged from before. The patient overall was symptomatically back to baseline and he was allowed home in stable condition. He was advised to hold his Lasix for a few days and advised to follow-up with me, Dr. Marcello FennelHande, in the clinic in 1 to 2 weeks' time.   MEDICATIONS ON DISCHARGE: 1. Ecotrin 325 mg p.o. daily.  2. Metoprolol 50 mg half a tablet b.i.d.   3. Glucosamine/chondroitin with MSM. 4. Tamsulosin 0.4 mg once a day.  5. Morphine 30 mg b.i.d.  6. Oxycodone 5/325 one tablet p.o. b.i.d. p.r.n.  7. NitroQuick p.r.n.  8. TriCor 200 mg p.o. at bedtime.  9. Simvastatin 40 mg at bedtime.  10. Omeprazole 20 mg once a day.  11. Paxil 40 mg a day.  12. Multivitamin and minerals.  13. Vitamin D3 1000 units once a day.  14. Senokot  S 2 tabs in the p.Randall David. 15. TUMS at bedtime p.r.n.  16. Coenzyme Q10 20 mg in a.Randall David.  17. Dulcolax laxatives as needed.   FOLLOW-UP: The patient has been advised to follow-up in the clinic with me,  Dr. Marcello Fennel, in 1 to 2 weeks' time. He was advised to call if he has any questions or concerns.   ____________________________ Barbette Reichmann, MD vh:drc D: 04/07/2011 15:42:55 ET T: 04/08/2011 10:51:03 ET JOB#: [id number]  cc: Barbette Reichmann, MD, <Dictator> Barbette Reichmann MD ELECTRONICALLY SIGNED 04/16/2011 12:37

## 2014-06-24 NOTE — H&P (Signed)
PATIENT NAME:  Randall David, Randall David MR#:  [id number] DATE OF BIRTH:  [birth year]  DATE OF ADMISSION:  04/02/2011  PRIMARY CARE PHYSICIAN: Randall ReichmannVishwanath Hande, MD   CHIEF COMPLAINT: Left lower extremity weakness today.   HISTORY OF PRESENT ILLNESS: Mr. Randall David is a very pleasant 81 year old Caucasian gentleman with multiple medical problems, was at his eye doctor's appointment this morning, tried to get up when his turn came; however, he could not get up from the seat and felt very weak mainly in his left lower extremity. He felt numb at that time. He could not complete the appointment. He was sent to the Emergency Room for further evaluation and management. The patient says his symptoms resolved after coming to the Emergency Room. He was also found to be mildly dehydrated. His baseline creatinine is around 1.4 to 1.7. His creatinine today is 2.1. The patient did have generalized weakness but seems to be getting better. CT of the head in the ER is negative.   PAST MEDICAL/SURGICAL HISTORY: 1. Atherosclerotic heart disease.  2. Congestive heart failure.  3. Chronic obstructive pulmonary disease.  4. Coronary artery disease, status post CABG x2.  5. Hypertension. 6. Hyperlipidemia  7. Benign prostatic hypertrophy.  8. Hip replacement x2.  9. History of left bundle branch block.  10. Hyperlipidemia.  11. Depression.  12. Recurrent gastric ulcers.  13. Anemia.  14. Bypass x2 in 1986, x4 in 1999 and in September 2007.  15. Right hip replacement in 1986 and 1999.  16. Right hip surgery in 2005.   ALLERGIES: No known drug allergies.  SOCIAL HISTORY: He is married, lives at home with his wife. Nonsmoker. Nonalcoholic.    FAMILY HISTORY: Positive for hypertension and heart disease.   MEDICATIONS:  1. Aspirin 81 mg daily.  2. Coenzyme Q-10, 200 mg in the morning.  3. Dulcolax laxative, take 2 in the evening as needed.  4. Fish oil 1000 mg, 2 pills daily.  5. Lasix 40 mg daily, if needed.   6. Glucosamine and chondroitin 1500 mg in the morning.  7. Metoprolol tartrate 50 mg, 1/2 tablet b.i.d.    8. Morphine 30 mg, 1 b.i.d.  9. Multivitamin p.o. daily.  10. NitroQuick as needed.  11. Omeprazole 20 mg b.i.d.  12. Oxycodone 5/325 mg, 1 pill b.i.d. as needed.  13. Paxil 40 mg daily.  14. Senokot S, takes 2 in the evening.  15. Simvastatin 40 mg at bedtime.  16. Tamsulosin 0.4 mg p.o. daily.  17. TriCor 200 mg at bedtime.  18. TUMS as needed.  19. Vitamin D3, 1000 international units daily.    REVIEW OF SYSTEMS: CONSTITUTIONAL: No fever. Positive for fatigue, weakness. EYES: No blurred or double vision. ENT: No tinnitus, ear pain, hearing loss. RESPIRATORY: No cough, wheeze, hemoptysis. CARDIOVASCULAR: No chest pain, orthopnea, or edema. GASTROINTESTINAL: No nausea, vomiting, diarrhea, or abdominal pain. GU: No dysuria or hematuria. ENDOCRINE: No polyuria or nocturia. HEMATOLOGY: No anemia or easy bruising. SKIN: No acne or rash. MUSCULOSKELETAL: Positive for arthritis. NEUROLOGIC: Positive for left lower extremity weakness. PSYCHIATRIC: No anxiety. Positive for chronic depression. All other systems reviewed and negative.   PHYSICAL EXAMINATION:  GENERAL: The patient is awake, alert, and oriented x3, not in acute distress.   VITAL SIGNS: He is afebrile, pulse is 76, blood pressure is 115/60, pulse oximetry is 99% on room air.  HEENT: Atraumatic, normocephalic. Pupils are equal, round, and reactive to light and accommodation. Extraocular movements are intact. Oral mucosa is moist.   CARDIOVASCULAR:  Both the heart sounds are normal. Rate, rhythm is regular. PMI is not lateralized. Chest nontender.   EXTREMITIES: Good pedal pulses, good femoral pulses. No lower extremity edema.   NEUROLOGICAL: The patient is alert and oriented x3. Speech is clear. The patient has good motor and sensory. The patient's deep tendon jerks are 1+. Plantars are downgoing. Motor system exam: The patient  has strength 4+ out of 5 in both upper and lower extremities. Sensory exam appears normal. Gait: The patient ambulated in the Emergency Room with the staff with some unsteadiness which the patient tells is his baseline.   SKIN: Warm and dry.   PSYCHIATRIC: The patient is awake, alert, and oriented x3.   LABORATORY, DIAGNOSTIC AND RADIOLOGICAL DATA:  CT of the head without contrast: No acute intracranial process. Chronic small vessel ischemic disease.  CBC: Hemoglobin and hematocrit is 9.6 and 28.9, platelet count is 99, white count is 3.7. Glucose is 131, BUN is 44, creatinine is 2.11, sodium is 142, potassium 4.2, chloride 103, bicarbonate is 31, calcium is 8.6. SGOT is 62, total protein 7.1.  Troponin 0.03.   ASSESSMENT:  Randall David is a 81 year old with:  1. Left lower extremity weakness which was acute onset at the doctor's office, which has now resolved: The patient apparently was sitting for some time and tried to get up to get his appointment taken care of and experienced his symptoms. Possibilities are transient ischemic attack versus leg numbness as a result of  sitting for a prolonged period of time. Given the patient's multiple risk factors and atherosclerotic vascular disease, we will admit the patient, continue his aspirin as of now. If signs and symptoms worsen, consider MRI of the brain. We will have Physical Therapy see the patient for gait ambulation.  2. Acute on chronic kidney disease, baseline creatinine 1.7: Creatinine today is 2.1. The patient already got a liter of fluids. I will hold off on more fluids now.  3. Hypertension: Continue all home medications except furosemide, which I am holding.  4. Coronary artery disease, status post coronary artery bypass graft: The patient denies any chest pain at present. He appears to be stable. We will continue his NitroQuick as needed and the rest of his home medications.  5. Hyperlipidemia: On simvastatin.  6. Benign prostatic  hypertrophy: On tamsulosin.  7. Further work-up according to the patient's clinical course.   The hospital admission plan was discussed with the patient and the patient's wife, who is agreeable to it.   TIME SPENT: 50 minutes. ____________________________ Wylie Hail Allena Katz, MD sap:cbb D: 04/02/2011 18:49:30 ET T: 04/03/2011 08:43:36 ET JOB#: [id number]  cc: Caleigha Zale A. Allena Katz, MD, <Dictator> Randall Reichmann, MD Willow Ora MD ELECTRONICALLY SIGNED 04/04/2011 15:41
